# Patient Record
Sex: Female | Born: 1980 | Race: White | Hispanic: No | Marital: Single | Smoking: Current every day smoker
Health system: Southern US, Community
[De-identification: ages and names within clinical notes are randomized; demographics above are authoritative.]

## PROBLEM LIST (undated history)

## (undated) ENCOUNTER — Inpatient Hospital Stay: Payer: Self-pay

## (undated) DIAGNOSIS — F419 Anxiety disorder, unspecified: Secondary | ICD-10-CM

## (undated) DIAGNOSIS — K219 Gastro-esophageal reflux disease without esophagitis: Secondary | ICD-10-CM

## (undated) DIAGNOSIS — F410 Panic disorder [episodic paroxysmal anxiety] without agoraphobia: Secondary | ICD-10-CM

## (undated) DIAGNOSIS — Z789 Other specified health status: Secondary | ICD-10-CM

## (undated) HISTORY — PX: ABDOMINAL HYSTERECTOMY: SHX81

## (undated) HISTORY — PX: NO PAST SURGERIES: SHX2092

---

## 2004-01-13 ENCOUNTER — Emergency Department: Payer: Self-pay | Admitting: Emergency Medicine

## 2004-01-13 ENCOUNTER — Other Ambulatory Visit: Payer: Self-pay

## 2005-05-17 ENCOUNTER — Observation Stay: Payer: Self-pay | Admitting: Unknown Physician Specialty

## 2005-05-24 ENCOUNTER — Inpatient Hospital Stay: Payer: Self-pay | Admitting: Certified Nurse Midwife

## 2006-04-22 ENCOUNTER — Inpatient Hospital Stay: Payer: Self-pay | Admitting: Obstetrics and Gynecology

## 2007-02-14 ENCOUNTER — Emergency Department: Payer: Self-pay | Admitting: Emergency Medicine

## 2007-02-15 ENCOUNTER — Observation Stay: Payer: Self-pay

## 2008-10-29 ENCOUNTER — Emergency Department: Payer: Self-pay | Admitting: Emergency Medicine

## 2009-06-17 ENCOUNTER — Emergency Department: Payer: Self-pay | Admitting: Emergency Medicine

## 2010-04-05 ENCOUNTER — Emergency Department: Payer: Self-pay | Admitting: Emergency Medicine

## 2012-04-09 ENCOUNTER — Emergency Department: Payer: Self-pay | Admitting: Emergency Medicine

## 2012-04-09 LAB — CBC
HCT: 37.8 % (ref 35.0–47.0)
HGB: 12.1 g/dL (ref 12.0–16.0)
MCHC: 32 g/dL (ref 32.0–36.0)
MCV: 78 fL — ABNORMAL LOW (ref 80–100)
RBC: 4.88 10*6/uL (ref 3.80–5.20)
RDW: 14.9 % — ABNORMAL HIGH (ref 11.5–14.5)
WBC: 10.9 10*3/uL (ref 3.6–11.0)

## 2012-04-09 LAB — HCG, QUANTITATIVE, PREGNANCY: Beta Hcg, Quant.: 5154 m[IU]/mL — ABNORMAL HIGH

## 2013-02-14 ENCOUNTER — Emergency Department: Payer: Self-pay | Admitting: Emergency Medicine

## 2013-02-14 LAB — RAPID INFLUENZA A&B ANTIGENS

## 2014-01-31 ENCOUNTER — Emergency Department: Payer: Self-pay | Admitting: Emergency Medicine

## 2014-01-31 LAB — CBC
HCT: 35.6 % (ref 35.0–47.0)
HGB: 11.6 g/dL — ABNORMAL LOW (ref 12.0–16.0)
MCH: 24.7 pg — ABNORMAL LOW (ref 26.0–34.0)
MCHC: 32.5 g/dL (ref 32.0–36.0)
MCV: 76 fL — ABNORMAL LOW (ref 80–100)
PLATELETS: 186 10*3/uL (ref 150–440)
RBC: 4.69 10*6/uL (ref 3.80–5.20)
RDW: 15.8 % — AB (ref 11.5–14.5)
WBC: 8.7 10*3/uL (ref 3.6–11.0)

## 2014-01-31 LAB — URINALYSIS, COMPLETE
Bacteria: NONE SEEN
Bilirubin,UR: NEGATIVE
Blood: NEGATIVE
Glucose,UR: NEGATIVE mg/dL (ref 0–75)
Ketone: NEGATIVE
Leukocyte Esterase: NEGATIVE
NITRITE: NEGATIVE
PH: 6 (ref 4.5–8.0)
PROTEIN: NEGATIVE
RBC,UR: 1 /HPF (ref 0–5)
Specific Gravity: 1.016 (ref 1.003–1.030)
Squamous Epithelial: 1

## 2014-01-31 LAB — COMPREHENSIVE METABOLIC PANEL
ALT: 16 U/L
AST: 19 U/L (ref 15–37)
Albumin: 3.2 g/dL — ABNORMAL LOW (ref 3.4–5.0)
Alkaline Phosphatase: 92 U/L
Anion Gap: 9 (ref 7–16)
BILIRUBIN TOTAL: 0.1 mg/dL — AB (ref 0.2–1.0)
BUN: 7 mg/dL (ref 7–18)
CALCIUM: 7.9 mg/dL — AB (ref 8.5–10.1)
CO2: 18 mmol/L — AB (ref 21–32)
CREATININE: 0.35 mg/dL — AB (ref 0.60–1.30)
Chloride: 109 mmol/L — ABNORMAL HIGH (ref 98–107)
EGFR (African American): >60
EGFR (Non-African Amer.): >60
GLUCOSE: 89 mg/dL (ref 65–99)
Osmolality: 269 (ref 275–301)
Potassium: 3.9 mmol/L (ref 3.5–5.1)
Sodium: 136 mmol/L (ref 136–145)
TOTAL PROTEIN: 7 g/dL (ref 6.4–8.2)

## 2014-01-31 LAB — HCG, QUANTITATIVE, PREGNANCY: Beta Hcg, Quant.: 17905 m[IU]/mL — ABNORMAL HIGH

## 2014-03-10 ENCOUNTER — Emergency Department: Payer: Self-pay | Admitting: Emergency Medicine

## 2014-03-10 LAB — CBC
HCT: 36.3 % (ref 35.0–47.0)
HGB: 11.8 g/dL — ABNORMAL LOW (ref 12.0–16.0)
MCH: 25.5 pg — ABNORMAL LOW (ref 26.0–34.0)
MCHC: 32.4 g/dL (ref 32.0–36.0)
MCV: 79 fL — AB (ref 80–100)
Platelet: 196 10*3/uL (ref 150–440)
RBC: 4.62 10*6/uL (ref 3.80–5.20)
RDW: 15.2 % — ABNORMAL HIGH (ref 11.5–14.5)
WBC: 9.7 10*3/uL (ref 3.6–11.0)

## 2014-03-10 LAB — URINALYSIS, COMPLETE
Bacteria: NONE SEEN
Bilirubin,UR: NEGATIVE
Glucose,UR: NEGATIVE mg/dL (ref 0–75)
KETONE: NEGATIVE
Leukocyte Esterase: NEGATIVE
Nitrite: NEGATIVE
Ph: 7 (ref 4.5–8.0)
Protein: NEGATIVE
Specific Gravity: 1.015 (ref 1.003–1.030)
Squamous Epithelial: 7
WBC UR: 2 /HPF (ref 0–5)

## 2014-03-10 LAB — WET PREP, GENITAL

## 2014-03-10 LAB — HCG, QUANTITATIVE, PREGNANCY: BETA HCG, QUANT.: 3538 m[IU]/mL — AB

## 2014-04-19 ENCOUNTER — Observation Stay: Payer: Self-pay | Admitting: Obstetrics and Gynecology

## 2014-04-19 LAB — URINALYSIS, COMPLETE
Bacteria: NONE SEEN
Bilirubin,UR: NEGATIVE
Glucose,UR: NEGATIVE mg/dL (ref 0–75)
Ketone: NEGATIVE
Leukocyte Esterase: NEGATIVE
NITRITE: NEGATIVE
PH: 7 (ref 4.5–8.0)
Protein: NEGATIVE
RBC,UR: 1 /HPF (ref 0–5)
SPECIFIC GRAVITY: 1.016 (ref 1.003–1.030)
Squamous Epithelial: 1
WBC UR: 1 /HPF (ref 0–5)

## 2014-04-19 LAB — WBC: WBC: 10.9 10*3/uL (ref 3.6–11.0)

## 2014-04-19 LAB — DRUG SCREEN, URINE

## 2014-04-19 LAB — PLATELET COUNT: Platelet: 189 10*3/uL (ref 150–440)

## 2014-04-19 LAB — RAPID HIV SCREEN (HIV 1/2 AB+AG)

## 2014-04-19 LAB — HEMOGLOBIN: HGB: 10.6 g/dL — ABNORMAL LOW (ref 12.0–16.0)

## 2014-04-19 LAB — WET PREP, GENITAL

## 2014-04-19 LAB — GLUCOSE, RANDOM: Glucose: 90 mg/dL (ref 65–99)

## 2014-04-19 LAB — RBC: RBC: 4.08 10*6/uL (ref 3.80–5.20)

## 2014-04-19 LAB — HEMATOCRIT: HCT: 32.9 % — ABNORMAL LOW (ref 35.0–47.0)

## 2014-04-20 LAB — GC/CHLAMYDIA PROBE AMP

## 2014-07-25 ENCOUNTER — Observation Stay
Admit: 2014-07-25 | Disposition: A | Discharge status: Home / Self Care | Payer: Self-pay | Attending: Certified Nurse Midwife | Admitting: Certified Nurse Midwife

## 2014-08-08 ENCOUNTER — Inpatient Hospital Stay
Admit: 2014-08-08 | Disposition: A | Discharge status: Home / Self Care | Payer: Self-pay | Source: Ambulatory Visit | Attending: Certified Nurse Midwife | Admitting: Certified Nurse Midwife

## 2014-08-08 LAB — CBC WITH DIFFERENTIAL/PLATELET
BASOS ABS: 0.1 10*3/uL (ref 0.0–0.1)
Basophil %: 0.5 %
Eosinophil #: 0.1 10*3/uL (ref 0.0–0.7)
Eosinophil %: 0.8 %
HCT: 32.7 % — ABNORMAL LOW (ref 35.0–47.0)
HGB: 10.6 g/dL — ABNORMAL LOW (ref 12.0–16.0)
Lymphocyte #: 2.3 10*3/uL (ref 1.0–3.6)
Lymphocyte %: 15.8 %
MCH: 24.2 pg — AB (ref 26.0–34.0)
MCHC: 32.4 g/dL (ref 32.0–36.0)
MCV: 74 fL — AB (ref 80–100)
MONO ABS: 0.9 x10 3/mm (ref 0.2–0.9)
Monocyte %: 6.4 %
Neutrophil #: 10.9 10*3/uL — ABNORMAL HIGH (ref 1.4–6.5)
Neutrophil %: 76.5 %
PLATELETS: 219 10*3/uL (ref 150–440)
RBC: 4.4 10*6/uL (ref 3.80–5.20)
RDW: 15.7 % — AB (ref 11.5–14.5)
WBC: 14.3 10*3/uL — ABNORMAL HIGH (ref 3.6–11.0)

## 2014-08-08 LAB — DRUG SCREEN, URINE
AMPHETAMINES, UR SCREEN: NEGATIVE
Barbiturates, Ur Screen: NEGATIVE
Benzodiazepine, Ur Scrn: NEGATIVE
Cannabinoid 50 Ng, Ur ~~LOC~~: NEGATIVE
Cocaine Metabolite,Ur ~~LOC~~: NEGATIVE
MDMA (Ecstasy)Ur Screen: NEGATIVE
Methadone, Ur Screen: NEGATIVE
OPIATE, UR SCREEN: NEGATIVE
PHENCYCLIDINE (PCP) UR S: NEGATIVE
Tricyclic, Ur Screen: NEGATIVE

## 2014-08-09 LAB — PLATELET COUNT: PLATELETS: 218 10*3/uL (ref 150–440)

## 2014-08-10 LAB — HEMATOCRIT: HCT: 29.1 % — AB (ref 35.0–47.0)

## 2014-08-11 LAB — BETA STREP CULTURE(ARMC)

## 2014-08-21 NOTE — H&P (Signed)
L&D Evaluation:  History:  HPI 34 year old G9 P6026 with EDC=08/18/2014 by a 11.4 week ultrasound presents at 36.4 weeks with c/o LOF today and contractions, sometimes 15 min apart. No vulvar itching or irritation. Had late onset prenatal care at [facility] 2 weeks ago, other than some [facility] problems. She states [facility] and was found to have oligohydraminos (4.6 cm).  Was started on biweekly NSTs and repeat AFI was 8cm. PNC/hx also remarkable for being RH neg (was given Rhogam at 16 weeks and again on 3/30, smoking, anemia, and obesity. Has had 6 vaginal deliveries in 2002, 2003, 2005, 2007, 2008, 2009. Delivery im 2002 with macrosomic infant/ VAVD/shoulder dystocia. Her last baby had a cardiac defect requiring surgery and this baby was adopted out. O neg, RI, VI,   Presents with contractions, leaking fluid   Patient's Medical History Anemia    Patient's Surgical History none    Medications Pre Natal Vitamins  Iron    Allergies NKDA   Social History tobacco    Family History Non-Contributory    ROS:  ROS see HPI   Exam:  Vital Signs 136/73    Urine Protein not completed   General no apparent distress   Mental Status clear    Abdomen gravid, non-tender   Estimated Fetal Weight Average for gestational age   Fetal Position cephalic   Pelvic no external lesions, SSE: negative pooling, Nitrazine or fern. wet prep negative. Cx: multip/closed internal os/-2   Mebranes Intact   FHT normal rate with no decels, 145 with accles to 180-Cat1   Ucx absent   Skin dry   Impression:  Impression reactive NST, IUP at 36 4/7 weeks with no evidence of SROM or labor.   Plan:  Plan DC home and FU with [facility] as scheduled   Electronic Signatures: Trinna BalloonGutierrez, Reylene Stauder L (CNM)  (Signed 13-Apr-16 21:28)  Authored: L&D Evaluation   Last Updated: 13-Apr-16 21:28 by Trinna BalloonGutierrez, Dimetrius Montfort L (CNM)

## 2014-08-21 NOTE — H&P (Signed)
L&D Evaluation:  History:  HPI 34 year old G9 P6026 with EDC=08/18/2014 by a 11.4 week ultrasound presents at 38.4 weeks from [facility] for a NST. [facility] but refuses to go there for any care since [facility]. Had late onset prenatal care at [facility] at 34 weeks, other than some episodic [facility] for problems such as first trimester bleeding. She states [facility] and was found to have oligohydraminos (4.6 cm).  Was started on biweekly NSTs and a BPP at that time was 8/10. PNC/hx also remarkable for being RH neg (was given Rhogam at 16 weeks and again on 3/30, smoking, anemia, and obesity. Has had 6 vaginal deliveries in 2002, 2003, 2005, 2007, 2008, 2009. Delivery im 2002 with macrosomic infant/ VAVD/shoulder dystocia. Her last baby had a cardiac defect requiring surgery and this baby was adopted out. O neg, RI, VI, GBS unknown.   Presents with NST from [facility]   Patient's Medical History Anemia   Patient's Surgical History none   Medications Pre [place]atal Vitamins  Iron   Allergies NKDA   Social History tobacco   Family History Non-Contributory   ROS:  ROS see HPI   Exam:  Vital Signs stable   Urine Protein not completed   General no apparent distress   Mental Status clear   Abdomen gravid, non-tender   Estimated Fetal Weight Average for gestational age   Fetal Position cephalic   Pelvic no external lesions, 3/85-90/-1   Mebranes Intact   FHT normal rate with no decels, 130s, moderate variability, +accels, no decels   Ucx irregular   Skin dry, no lesions, no rashes   Other AFI today 6.7, no fetal movements or breathing seen on U/S.   Impression:  Impression reactive NST, IUP at 336w4d, insufficient prenatal care, oligohydramnios   Plan:  Plan Will keep for IOL. ABX for GBS, pitocin, UDS, anticipate svd.   Comments Plan discussed with Dr. Vergie LivingPickens- who agrees with the plan for induction due  to insufficient care, oligohydramnios, and scan today with no fetal movement or breathing. Pt made aware and accepts plan of care.   Follow Up Appointment need to schedule   Electronic Signatures: Jannet MantisSubudhi, Gerrad Welker (CNM)  (Signed 27-Apr-16 18:43)  Authored: L&D Evaluation   Last Updated: 27-Apr-16 18:43 by Jannet MantisSubudhi, Labrina Lines (CNM)

## 2014-08-21 NOTE — H&P (Signed)
L&D Evaluation:  History Expanded:  HPI 34 yo [id number] at 6347w5d gestational age by 11 week ultrasound.  Pregnancy complicted by no prenatal care to this point. She presents with an episode of vaginal bleeding that occured early this morning.  She was doing some housework and felt something wet.  When she went to the bathroom she noticed about a 7cm diameter spot that was dark red.  She has had a decrease in the amount she has noticed since then with the darkness decreasing along with a decrease in amount.  She denies a gush of fluid and abdominal pain. She notes positive fetal movement.  She had an episode of vaginal bleeding at 16 weeks for which no etiology was discovered.  She smokes 1/2 ppd cigarettes.  She denies urinary and vaginal symptoms.  Denies having hemorrhoids.  She has also been feeling dizzy.   Blood Type (Maternal) Rh negative   Group B Strep Results Maternal (Result >5wks must be treated as unknown) unknown/result > 5 weeks ago   Maternal HIV Unknown   Maternal Syphilis Ab Unknown   Maternal Varicella Unknown   Rubella Results (Maternal) unknown   Maternal T-Dap Immune   [facility]   Patient's Medical History No Chronic Illness   Patient's Surgical History none   Medications Denies   Allergies NKDA   Social History tob 1/2 ppd, denies EtOH and drug use   Family History Non-Contributory   ROS:  ROS All systems were reviewed.  HEENT, CNS, GI, GU, Respiratory, CV, Renal and Musculoskeletal systems were found to be normal., unless noted in HPI   Exam:  Vital Signs afebrile, normotensive, other vitals normal   General no apparent distress   Mental Status clear   Chest clear   Heart normal sinus rhythm   Abdomen gravid, non-tender   Estimated Fetal Weight appropriate for 22 wks (fundal height about 2cm above umb)   Back no CVAT   Edema no edema   Pelvic no external lesions, cervix closed and thick, scant old-appearing blood in vaginal vault, no  lesions identified.  Cervix difficlt to visualize given redundancy of vaginal tissue   Mebranes Intact   FHT 150 bpm   Ucx absent   Skin no lesions   Impression:  Impression 1) Intrauterine pregnancy at 4747w5d gestational age 59) Vaginal bleeding of uncertain source   Plan:  Comments 1) Vaginal bleeding: obtain ultrasound transabdominally to assess placenta, one vaginally for cervical length.  gonorrhea/chlamydia NAAT, wet prep, UA, UDS 2) no prenatal care: obtain labs, if not already obtained   Electronic Signatures: Conard NovakJackson, Cire Deyarmin D (MD)  (Signed 07-Jan-16 17:15)  Authored: L&D Evaluation   Last Updated: 07-Jan-16 17:15 by Conard NovakJackson, Resean Brander D (MD)

## 2015-04-14 NOTE — L&D Delivery Note (Signed)
Delivery Note  Pt admitted in active labor 12 hrs ago. Stalled at 8 cm, progressed with 2 hours of pitocin and an epidural.  At 11:48 AM a viable and healthy female infant "[name]" was delivered via Vaginal, Spontaneous Delivery (Presentation:OA ).  APGAR: 8, 9; weight 7 lb 4.8 oz (3310 g).   Placenta status: Intact, Spontaneous.  Cord:  with the following complications: None.  Cord pH: not drawn  Nuchal cord tight and not reduced, was delivered through.  Anesthesia: Epidural  Episiotomy: None Lacerations: None Suture Repair: n/a Est. Blood Loss (mL):    Mom to postpartum.  Baby to Couplet care / Skin to Skin.  Kayla DouglasBEASLEY, Kayla Howell 09/17/2015, 11:58 AM

## 2015-05-06 DIAGNOSIS — O0930 Supervision of pregnancy with insufficient antenatal care, unspecified trimester: Secondary | ICD-10-CM | POA: Insufficient documentation

## 2015-05-06 DIAGNOSIS — O99332 Smoking (tobacco) complicating pregnancy, second trimester: Secondary | ICD-10-CM | POA: Insufficient documentation

## 2015-05-06 DIAGNOSIS — O4100X0 Oligohydramnios, unspecified trimester, not applicable or unspecified: Secondary | ICD-10-CM | POA: Insufficient documentation

## 2015-05-06 DIAGNOSIS — F53 Postpartum depression: Secondary | ICD-10-CM | POA: Insufficient documentation

## 2015-05-06 DIAGNOSIS — E669 Obesity, unspecified: Secondary | ICD-10-CM | POA: Insufficient documentation

## 2015-05-07 DIAGNOSIS — D509 Iron deficiency anemia, unspecified: Secondary | ICD-10-CM | POA: Insufficient documentation

## 2015-09-12 ENCOUNTER — Encounter: Payer: Self-pay | Admitting: *Deleted

## 2015-09-12 ENCOUNTER — Inpatient Hospital Stay
Admission: EM | Admit: 2015-09-12 | Discharge: 2015-09-12 | Disposition: A | Discharge status: Home / Self Care | Payer: Self-pay | Attending: Obstetrics and Gynecology | Admitting: Obstetrics and Gynecology

## 2015-09-12 ENCOUNTER — Inpatient Hospital Stay: Payer: Self-pay

## 2015-09-12 DIAGNOSIS — Z3A36 36 weeks gestation of pregnancy: Secondary | ICD-10-CM | POA: Insufficient documentation

## 2015-09-12 DIAGNOSIS — O0933 Supervision of pregnancy with insufficient antenatal care, third trimester: Secondary | ICD-10-CM

## 2015-09-12 DIAGNOSIS — R109 Unspecified abdominal pain: Secondary | ICD-10-CM | POA: Insufficient documentation

## 2015-09-12 DIAGNOSIS — O26893 Other specified pregnancy related conditions, third trimester: Secondary | ICD-10-CM | POA: Insufficient documentation

## 2015-09-12 DIAGNOSIS — O09293 Supervision of pregnancy with other poor reproductive or obstetric history, third trimester: Secondary | ICD-10-CM

## 2015-09-12 LAB — DIFFERENTIAL
Basophils Absolute: 0 10*3/uL (ref 0–0.1)
Basophils Relative: 0 %
EOS ABS: 0.1 10*3/uL (ref 0–0.7)
Lymphocytes Relative: 14 %
Lymphs Abs: 1.8 10*3/uL (ref 1.0–3.6)
Monocytes Absolute: 0.9 10*3/uL (ref 0.2–0.9)
Neutro Abs: 9.5 10*3/uL — ABNORMAL HIGH (ref 1.4–6.5)
Neutrophils Relative %: 77 %

## 2015-09-12 LAB — URINALYSIS COMPLETE WITH MICROSCOPIC (ARMC ONLY)
BILIRUBIN URINE: NEGATIVE
Glucose, UA: NEGATIVE mg/dL
Hgb urine dipstick: NEGATIVE
KETONES UR: NEGATIVE mg/dL
LEUKOCYTES UA: NEGATIVE
NITRITE: NEGATIVE
PH: 8 (ref 5.0–8.0)
Protein, ur: NEGATIVE mg/dL
RBC / HPF: NONE SEEN RBC/hpf (ref 0–5)
SPECIFIC GRAVITY, URINE: 1.016 (ref 1.005–1.030)

## 2015-09-12 LAB — TYPE AND SCREEN
ABO/RH(D): O NEG
Antibody Screen: NEGATIVE

## 2015-09-12 LAB — URINE DRUG SCREEN, QUALITATIVE (ARMC ONLY)
AMPHETAMINES, UR SCREEN: NOT DETECTED
Barbiturates, Ur Screen: NOT DETECTED
Benzodiazepine, Ur Scrn: NOT DETECTED
COCAINE METABOLITE, UR ~~LOC~~: NOT DETECTED
Cannabinoid 50 Ng, Ur ~~LOC~~: NOT DETECTED
MDMA (Ecstasy)Ur Screen: NOT DETECTED
METHADONE SCREEN, URINE: NOT DETECTED
Opiate, Ur Screen: NOT DETECTED
PHENCYCLIDINE (PCP) UR S: NOT DETECTED
Tricyclic, Ur Screen: NOT DETECTED

## 2015-09-12 LAB — ABO/RH: ABO/RH(D): O NEG

## 2015-09-12 LAB — CBC
HCT: 30.5 % — ABNORMAL LOW (ref 35.0–47.0)
HEMOGLOBIN: 9.5 g/dL — AB (ref 12.0–16.0)
MCH: 21.9 pg — AB (ref 26.0–34.0)
MCHC: 31.2 g/dL — ABNORMAL LOW (ref 32.0–36.0)
MCV: 70.5 fL — ABNORMAL LOW (ref 80.0–100.0)
Platelets: 242 10*3/uL (ref 150–440)
RBC: 4.33 MIL/uL (ref 3.80–5.20)
RDW: 16.5 % — AB (ref 11.5–14.5)
WBC: 12.4 10*3/uL — ABNORMAL HIGH (ref 3.6–11.0)

## 2015-09-12 LAB — OB RESULTS CONSOLE GBS: GBS: NEGATIVE

## 2015-09-12 LAB — RAPID HIV SCREEN (HIV 1/2 AB+AG)
HIV 1/2 Antibodies: NONREACTIVE
HIV-1 P24 Antigen - HIV24: NONREACTIVE

## 2015-09-12 MED ORDER — FLUCONAZOLE 50 MG PO TABS
ORAL_TABLET | ORAL | Status: AC
  Administered 2015-09-12: 150 mg via ORAL
  Filled 2015-09-12: qty 3

## 2015-09-12 MED ORDER — FLUCONAZOLE 50 MG PO TABS
150.0000 mg | ORAL_TABLET | Freq: Once | ORAL | Status: AC
  Administered 2015-09-12: 150 mg via ORAL

## 2015-09-12 NOTE — Plan of Care (Signed)
Report to MD regarding an additional issue with the patients limited prenatal care.   Pt is A negative blood type. Pt states that she did not receive Rhogam with this pregnancy. RN discussed need for her to have this. She states that she was told she did not need this until the baby was born and then would receive it if needed. I again discussed with her that she did need it at this time and that when the baby delivers the babies blood would be tested for further need.  Pt agrees she will discuss with MD at appt that has been scheduled for Monday, June 5 at 0930. MD aware of this. Ellison Carwin Cassanda Walmer RNC

## 2015-09-12 NOTE — Plan of Care (Signed)
Discharge instructions, both oral and written, given to pt. She has next appt to restart her [facility] Monday, June 5 at 0930.  Pt given medication for her yeast infection and is aware of pending urine testing per MD.  Pt ready to leave department in stable condition ambulatory for home.  Ellison Carwin Areil Ottey RNC

## 2015-09-12 NOTE — Discharge Instructions (Signed)
[facility] in next few days. RN getting appt for pt. For Monday at 0930 June 5. Pt agrees with plan of care.  Pt given medication for yeast as ordered per MD. Pt ready to leave dept in stable condition ambulatory. Kayla Howell Brittanie Dosanjh RNC

## 2015-09-12 NOTE — OB Triage Provider Note (Addendum)
TRIAGE VISIT with NST   Kayla Howell is a 35 y.o. G7P6. She is at [redacted]w[redacted]d gestation, presenting with uterine contractions  Indication: Contractions  S: Resting comfortably. No CTX, no VB. Active fetal movement. Concerned about back cramping, worries about oligo.  Preg c/b: - Insufficient prenatal care: 1 new OB visit at 18+5 wks, dated from that 2nd trimester scan. No prenatal visits since that time. - Hx of oligo with prior pregnancy, IOL at 101 wks - Grand multiparity: G7P6 with 6 NSVD- desires BTL - 1/2 ppd smoking - Insuffienct material resources  O:  BP 130/78 mmHg  Pulse 85  Temp(Src) 98.3 F (36.8 C) (Oral)  Resp 18  Ht  (1.778 m)  Wt 200 lb (90.719 kg)  BMI 28.70 kg/m2 Results for orders placed or performed during the hospital encounter of 09/12/15 (from the past 48 hour(s))  Urine Drug Screen, Qualitative ([facility] only)   Collection Time: 09/12/15 12:13 PM  Result Value Ref Range   Tricyclic, Ur Screen NONE DETECTED NONE DETECTED   Amphetamines, Ur Screen NONE DETECTED NONE DETECTED   MDMA (Ecstasy)Ur Screen NONE DETECTED NONE DETECTED   Cocaine Metabolite,Ur [place] NONE DETECTED NONE DETECTED   Opiate, Ur Screen NONE DETECTED NONE DETECTED   Phencyclidine (PCP) Ur S NONE DETECTED NONE DETECTED   Cannabinoid 50 Ng, Ur  NONE DETECTED NONE DETECTED   Barbiturates, Ur Screen NONE DETECTED NONE DETECTED   Benzodiazepine, Ur Scrn NONE DETECTED NONE DETECTED   Methadone Scn, Ur NONE DETECTED NONE DETECTED  CBC   Collection Time: 09/12/15 12:20 PM  Result Value Ref Range   WBC 12.4 (H) 3.6 - 11.0 K/uL   RBC 4.33 3.80 - 5.20 MIL/uL   Hemoglobin 9.5 (L) 12.0 - 16.0 g/dL   HCT 16.1 (L) 09.6 - 04.5 %   MCV 70.5 (L) 80.0 - 100.0 fL   MCH 21.9 (L) 26.0 - 34.0 pg   MCHC 31.2 (L) 32.0 - 36.0 g/dL   RDW 40.9 (H) 81.1 - 91.4 %   Platelets 242 150 - 440 K/uL  Differential   Collection Time: 09/12/15 12:20 PM  Result Value Ref Range   Neutrophils Relative % 77% %   Neutro  Abs 9.5 (H) 1.4 - 6.5 K/uL   Lymphocytes Relative 14% %   Lymphs Abs 1.8 1.0 - 3.6 K/uL   Monocytes Relative 8% %   Monocytes Absolute 0.9 0.2 - 0.9 K/uL   Eosinophils Relative 1% %   Eosinophils Absolute 0.1 0 - 0.7 K/uL   Basophils Relative 0% %   Basophils Absolute 0.0 0 - 0.1 K/uL  Rapid HIV screen (HIV 1/2 Ab+Ag)   Collection Time: 09/12/15 12:20 PM  Result Value Ref Range   HIV-1 P24 Antigen - HIV24 NON REACTIVE NON REACTIVE   HIV 1/2 Antibodies NON REACTIVE NON REACTIVE   Interpretation (HIV Ag Ab)      A non reactive test result means that HIV 1 or HIV 2 antibodies and HIV 1 p24 antigen were not detected in the specimen.  Type and screen [facility]   Collection Time: 09/12/15 12:20 PM  Result Value Ref Range   ABO/RH(D) PENDING    Antibody Screen PENDING    Sample Expiration 09/15/2015      Urine sample pending analyusis  Gen: NAD, AAOx3      Abd: FNTTP      Ext: Non-tender, Nonedmeatous    FHT: 145, mod var, +accels, no decels TOCO: quiet NWG:NFAOZHYQ: 2 Effacement (%):  50 Station:  (-4) Presentation: Vertex Exam by:: BB MD  NST: Reactive. See FHT above for particulars.  A/P:  35 y.o. G1P0 6360w5d with cramping.   Labor: not present.   R/o ROM: SSE positive for pseudohyphae. Diflucan ordered  Incomplete prenatal care: Labs including GBS ordered today  Desires BTL: MA31 papers signed with patient today.  Fetal Wellbeing: Reassuring Cat 1 tracing.  Hx of oligo: Formal u/s with AFI of 10, vtx. D/c home stable, precautions reviewed. [facility]

## 2015-09-12 NOTE — Plan of Care (Signed)
Pt left department with discharge with her saline lock in place that was initiated per EMS.  RN tried to find patient in hospital soon after her departure but was not located.   Patient's phone numbers, cell and home, all called to locate pt. But she did not answer any of these numbers.  Will continue to try and call patient. Ellison Carwin Danyell Shader RNC

## 2015-09-13 LAB — HEPATITIS B SURFACE ANTIGEN: Hepatitis B Surface Ag: NEGATIVE

## 2015-09-13 LAB — RPR: RPR Ser Ql: NONREACTIVE

## 2015-09-13 LAB — RUBELLA SCREEN: Rubella: 1.58 index (ref >0.99)

## 2015-09-14 LAB — CULTURE, BETA STREP (GROUP B ONLY)

## 2015-09-16 ENCOUNTER — Inpatient Hospital Stay
Admit: 2015-09-16 | Discharge: 2015-09-19 | DRG: 775 | Disposition: A | Discharge status: Home / Self Care | Payer: Self-pay | Attending: Obstetrics and Gynecology | Admitting: Obstetrics and Gynecology

## 2015-09-16 DIAGNOSIS — Z3A37 37 weeks gestation of pregnancy: Secondary | ICD-10-CM

## 2015-09-16 DIAGNOSIS — Z3490 Encounter for supervision of normal pregnancy, unspecified, unspecified trimester: Secondary | ICD-10-CM

## 2015-09-16 DIAGNOSIS — F17210 Nicotine dependence, cigarettes, uncomplicated: Secondary | ICD-10-CM | POA: Diagnosis present

## 2015-09-16 DIAGNOSIS — O0933 Supervision of pregnancy with insufficient antenatal care, third trimester: Secondary | ICD-10-CM

## 2015-09-16 DIAGNOSIS — O99334 Smoking (tobacco) complicating childbirth: Secondary | ICD-10-CM | POA: Diagnosis present

## 2015-09-16 HISTORY — DX: Other specified health status: Z78.9

## 2015-09-16 LAB — CBC
HEMATOCRIT: 30.8 % — AB (ref 35.0–47.0)
HEMOGLOBIN: 10 g/dL — AB (ref 12.0–16.0)
MCH: 22.4 pg — ABNORMAL LOW (ref 26.0–34.0)
MCHC: 32.4 g/dL (ref 32.0–36.0)
MCV: 69.1 fL — AB (ref 80.0–100.0)
Platelets: 284 10*3/uL (ref 150–440)
RBC: 4.46 MIL/uL (ref 3.80–5.20)
RDW: 16.4 % — ABNORMAL HIGH (ref 11.5–14.5)
WBC: 15.3 10*3/uL — AB (ref 3.6–11.0)

## 2015-09-16 MED ORDER — OXYTOCIN 40 UNITS IN LACTATED RINGERS INFUSION - SIMPLE MED
2.5000 [IU]/h | INTRAVENOUS | Status: DC
  Administered 2015-09-17: 2.5 [IU]/h via INTRAVENOUS
  Filled 2015-09-16: qty 1000

## 2015-09-16 MED ORDER — LACTATED RINGERS IV SOLN
INTRAVENOUS | Status: DC
  Administered 2015-09-16 – 2015-09-17 (×2): via INTRAVENOUS

## 2015-09-16 MED ORDER — ACETAMINOPHEN 325 MG PO TABS: 650.0000 mg | ORAL_TABLET | ORAL | Status: DC | PRN

## 2015-09-16 MED ORDER — OXYTOCIN BOLUS FROM INFUSION
500.0000 mL | INTRAVENOUS | Status: DC
  Administered 2015-09-17: 500 mL via INTRAVENOUS

## 2015-09-16 MED ORDER — LIDOCAINE HCL (PF) 1 % IJ SOLN: 30.0000 mL | INTRAMUSCULAR | Status: DC | PRN

## 2015-09-16 MED ORDER — ONDANSETRON HCL 4 MG/2ML IJ SOLN
4.0000 mg | Freq: Four times a day (QID) | INTRAMUSCULAR | Status: DC | PRN
  Administered 2015-09-17: 4 mg via INTRAVENOUS
  Filled 2015-09-16: qty 2

## 2015-09-16 MED ORDER — LACTATED RINGERS IV SOLN
500.0000 mL | INTRAVENOUS | Status: DC | PRN
  Administered 2015-09-16: 500 mL via INTRAVENOUS

## 2015-09-16 MED ORDER — SOD CITRATE-CITRIC ACID 500-334 MG/5ML PO SOLN
30.0000 mL | ORAL | Status: DC | PRN
  Administered 2015-09-17 (×2): 30 mL via ORAL
  Filled 2015-09-16 (×2): qty 15

## 2015-09-16 NOTE — Progress Notes (Signed)
Pt admitted to LDR 5 via EMS with c/o contractions. Cervical exam showed 7cm, no leaking fluid, Dr. Malena EdmanBeasly called and admitted for delivery.

## 2015-09-16 NOTE — H&P (Signed)
Kayla Howell is a 35 y.o. female [phone] at 37+2wks presenting for contractions. Dated from an 18wk u/s. LMP 11/25/14. Pelvis proven to 9#13 oz.   Preg c/b: - Insufficient prenatal care: 1 new OB visit at 18+5 wks, dated from that 2nd trimester scan. No prenatal visits since that time. Triage visit on 09/12/15 with normal fluid [facility]. - Hx of oligo with prior pregnancy, IOL at 1039 wks - Grand multiparity: G7P6 with 6 NSVD- desires BTL - 1/2 ppd smoking - Insufficient material resources - Rh neg in this pregnancy, no Rhogam given - Desires BTL: St[facility] at [facility]- however, MA31 papers signed on 09/12/15 in the triage area, but are unavailable today. 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- no meds - Discrepancy between Gs and Ps- [facility],  Date Out GA Labor/2nd Wt Sex Deliv Anes Pre Liv A1 A5 Name Loc Clin  2001 TAB 61108w0d              2001 SAB 7259w0d              09/15/2000 Term 3839w0d  9 lb 13 oz M Vag-Spont EPI N YES    Other   03/03/2002 Term 3939w0d  8 lb 2 oz M Vag-Spont EPI N YES    Other   08/31/2003 Term 8839w0d  8 lb 6 oz F Vag-Spont None N YES    Other   05/24/2005 Term 5739w0d  7 lb 11 oz F Vag-Spont None N YES    Other   2008 Term 839w0d  8 lb 2 oz  Vag-Spont  N YES       06/2007 Term 2339w0d  7 lb 8.8 oz M Vag-Spont EPI N YES        Gravida                  Maternal Medical History:  Reason for admission: Nausea.    OB History    Gravida Para Term Preterm AB TAB SAB Ectopic Multiple Living   9 6   2 1 1         Past Medical History  Diagnosis Date  . Medical history non-contributory    History reviewed. No pertinent past surgical history. Family History: family history is not on file. Social History:  reports that she has been smoking.  She does not have any smokeless tobacco history on file. She reports that she does not drink alcohol or use  illicit drugs.   Prenatal Transfer Tool    Review of Systems  Constitutional: Negative for fever and chills.  Eyes: Negative for blurred vision and double vision.  Respiratory: Negative for shortness of breath.   Cardiovascular: Negative for chest pain and palpitations.  Gastrointestinal: Negative for nausea, vomiting, abdominal pain, diarrhea and constipation.  Genitourinary: Negative for dysuria, urgency, frequency and flank pain.  Neurological: Negative for headaches.  Psychiatric/Behavioral: Negative for depression.    Dilation: 7 Effacement (%): 80 Station: +1 Exam by:: NS RN Blood pressure 143/78, pulse 108, temperature 97.3 F (36.3 C), temperature source Oral, resp. rate 18, height 5\' 10"  (1.778 m), weight 200 lb (90.719 kg). Maternal Exam:  Uterine Assessment: Contraction strength is moderate.  Contraction duration is 60 seconds. Contraction frequency is regular.   Abdomen: Patient reports no abdominal tenderness. Estimated fetal weight is 7#6oz.   Fetal presentation: vertex  Introitus: Normal vulva. Normal vagina.  Pelvis: adequate for delivery.  Cervix: Cervix evaluated by digital exam.     Fetal Exam Fetal State Assessment: Category I - tracings are normal.     Physical Exam  Constitutional: She is oriented to person, place, and time. She appears well-developed and well-nourished. No distress.  HENT:  Poor dentition  Eyes: No scleral icterus.  Neck: Normal range of motion. Neck supple.  Cardiovascular: Normal rate, regular rhythm, normal heart sounds and intact distal pulses.   Respiratory: Effort normal and breath sounds normal. No respiratory distress.  GI: Soft. She exhibits no distension. There is no tenderness.  Genitourinary: Vagina normal and uterus normal.  Musculoskeletal: Normal range of motion.  Neurological: She is alert and oriented to person, place, and time.  Skin: Skin is warm and dry.  Psychiatric: She has a normal mood and affect.     Prenatal labs: ABO, Rh: --/--/O NEG (06/01 1221) Antibody: NEG (06/01 1220) Rubella: 1.58 (06/01 1220) RPR: Non Reactive (06/01 1220)  HBsAg: Negative (06/01 1220)  HIV:   non reactive GBS: Negative (06/01 0000)   Assessment/Plan: 1. Fetal Well being  - Fetal Tracing: Cat I strip - Group B Streptococcus ppx indicated: none - Presentation: Vtx confirmed by [place]   2. Routine OB: - Prenatal labs reviewed, as above - Rh neg - Varicella unknown- Ab screen ordered with admission labs  3. Monitoring of Labor:  -  Contractions 5-6 external toco in place -  Pelvis proven to 9#13oz -  Plan for continuous fetal monitoring  -  Maternal pain control with iv or regional as desired - Anticipate vaginal delivery  4. Post Partum Planning: - Infant feeding: Bottle - Contraception: BTL   5. Elevated BP, mild range - PreE labs ordered on admission. Neg proteuria last week.  Christeen Douglas 09/17/2015, 12:03 AM

## 2015-09-17 ENCOUNTER — Inpatient Hospital Stay: Payer: Self-pay | Admitting: Anesthesiology

## 2015-09-17 ENCOUNTER — Encounter: Payer: Self-pay | Admitting: Obstetrics and Gynecology

## 2015-09-17 LAB — COMPREHENSIVE METABOLIC PANEL
ALT: 11 U/L — ABNORMAL LOW (ref 14–54)
ANION GAP: 11 (ref 5–15)
AST: 17 U/L (ref 15–41)
Albumin: 3.4 g/dL — ABNORMAL LOW (ref 3.5–5.0)
Alkaline Phosphatase: 184 U/L — ABNORMAL HIGH (ref 38–126)
BUN: 8 mg/dL (ref 6–20)
CHLORIDE: 104 mmol/L (ref 101–111)
CO2: 17 mmol/L — ABNORMAL LOW (ref 22–32)
Calcium: 9.3 mg/dL (ref 8.9–10.3)
Creatinine, Ser: 0.45 mg/dL (ref 0.44–1.00)
Glucose, Bld: 112 mg/dL — ABNORMAL HIGH (ref 65–99)
POTASSIUM: 3.8 mmol/L (ref 3.5–5.1)
Sodium: 132 mmol/L — ABNORMAL LOW (ref 135–145)
Total Bilirubin: 0.3 mg/dL (ref 0.3–1.2)
Total Protein: 7 g/dL (ref 6.5–8.1)

## 2015-09-17 MED ORDER — IBUPROFEN 600 MG PO TABS
ORAL_TABLET | ORAL | Status: AC
  Administered 2015-09-17: 600 mg via ORAL
  Filled 2015-09-17: qty 1

## 2015-09-17 MED ORDER — NALBUPHINE HCL 10 MG/ML IJ SOLN: 5.0000 mg | Freq: Once | INTRAMUSCULAR | Status: DC | PRN

## 2015-09-17 MED ORDER — COCONUT OIL OIL: 1.0000 "application " | TOPICAL_OIL | Status: DC | PRN

## 2015-09-17 MED ORDER — DIPHENHYDRAMINE HCL 25 MG PO CAPS: 25.0000 mg | ORAL_CAPSULE | ORAL | Status: DC | PRN

## 2015-09-17 MED ORDER — SODIUM CHLORIDE 0.9% FLUSH: 3.0000 mL | Freq: Two times a day (BID) | INTRAVENOUS | Status: DC

## 2015-09-17 MED ORDER — SODIUM CHLORIDE 0.9 % IV SOLN: 250.0000 mL | INTRAVENOUS | Status: DC | PRN

## 2015-09-17 MED ORDER — ZOLPIDEM TARTRATE 5 MG PO TABS: 5.0000 mg | ORAL_TABLET | Freq: Every evening | ORAL | Status: DC | PRN

## 2015-09-17 MED ORDER — NALOXONE HCL 0.4 MG/ML IJ SOLN: 0.4000 mg | INTRAMUSCULAR | Status: DC | PRN

## 2015-09-17 MED ORDER — SENNOSIDES-DOCUSATE SODIUM 8.6-50 MG PO TABS
2.0000 | ORAL_TABLET | ORAL | Status: DC
  Administered 2015-09-17 – 2015-09-18 (×2): 2 via ORAL
  Filled 2015-09-17 (×2): qty 2

## 2015-09-17 MED ORDER — LIDOCAINE-EPINEPHRINE (PF) 1.5 %-1:200000 IJ SOLN
INTRAMUSCULAR | Status: DC | PRN
  Administered 2015-09-17: 3 mL via EPIDURAL

## 2015-09-17 MED ORDER — TETANUS-DIPHTH-ACELL PERTUSSIS 5-2.5-18.5 LF-MCG/0.5 IM SUSP: 0.5000 mL | Freq: Once | INTRAMUSCULAR | Status: DC

## 2015-09-17 MED ORDER — FLEET ENEMA 7-19 GM/118ML RE ENEM: 1.0000 | ENEMA | Freq: Every day | RECTAL | Status: DC | PRN

## 2015-09-17 MED ORDER — MEASLES, MUMPS & RUBELLA VAC ~~LOC~~ INJ
0.5000 mL | INJECTION | Freq: Once | SUBCUTANEOUS | Status: DC
  Filled 2015-09-17: qty 0.5

## 2015-09-17 MED ORDER — SIMETHICONE 80 MG PO CHEW: 80.0000 mg | CHEWABLE_TABLET | ORAL | Status: DC | PRN

## 2015-09-17 MED ORDER — IBUPROFEN 600 MG PO TABS
600.0000 mg | ORAL_TABLET | Freq: Four times a day (QID) | ORAL | Status: DC
  Administered 2015-09-17 – 2015-09-19 (×6): 600 mg via ORAL
  Filled 2015-09-17 (×6): qty 1

## 2015-09-17 MED ORDER — MEPERIDINE HCL 25 MG/ML IJ SOLN: 6.2500 mg | INTRAMUSCULAR | Status: DC | PRN

## 2015-09-17 MED ORDER — AMMONIA AROMATIC IN INHA
RESPIRATORY_TRACT | Status: AC
  Filled 2015-09-17: qty 10

## 2015-09-17 MED ORDER — PRENATAL MULTIVITAMIN CH
1.0000 | ORAL_TABLET | Freq: Every day | ORAL | Status: DC
  Administered 2015-09-18: 1 via ORAL
  Filled 2015-09-17: qty 1

## 2015-09-17 MED ORDER — BENZOCAINE-MENTHOL 20-0.5 % EX AERO: 1.0000 "application " | INHALATION_SPRAY | CUTANEOUS | Status: DC | PRN

## 2015-09-17 MED ORDER — SODIUM CHLORIDE FLUSH 0.9 % IV SOLN
INTRAVENOUS | Status: AC
  Filled 2015-09-17: qty 10

## 2015-09-17 MED ORDER — NALBUPHINE HCL 10 MG/ML IJ SOLN: 5.0000 mg | INTRAMUSCULAR | Status: DC | PRN

## 2015-09-17 MED ORDER — TERBUTALINE SULFATE 1 MG/ML IJ SOLN: 0.2500 mg | Freq: Once | INTRAMUSCULAR | Status: DC | PRN

## 2015-09-17 MED ORDER — ACETAMINOPHEN 325 MG PO TABS: 650.0000 mg | ORAL_TABLET | ORAL | Status: DC | PRN

## 2015-09-17 MED ORDER — FENTANYL 2.5 MCG/ML W/ROPIVACAINE 0.2% IN NS 100 ML EPIDURAL INFUSION (ARMC-ANES)
10.0000 mL/h | EPIDURAL | Status: DC
  Administered 2015-09-17 (×2): 10 mL/h via EPIDURAL
  Filled 2015-09-17: qty 100

## 2015-09-17 MED ORDER — FENTANYL 2.5 MCG/ML W/ROPIVACAINE 0.2% IN NS 100 ML EPIDURAL INFUSION (ARMC-ANES)
EPIDURAL | Status: AC
  Administered 2015-09-17: 10 mL/h via EPIDURAL
  Filled 2015-09-17: qty 100

## 2015-09-17 MED ORDER — SODIUM CHLORIDE 0.9% FLUSH: 3.0000 mL | INTRAVENOUS | Status: DC | PRN

## 2015-09-17 MED ORDER — LIDOCAINE HCL (PF) 1 % IJ SOLN
INTRAMUSCULAR | Status: AC
  Filled 2015-09-17: qty 30

## 2015-09-17 MED ORDER — NALOXONE HCL 2 MG/2ML IJ SOSY
1.0000 ug/kg/h | PREFILLED_SYRINGE | INTRAMUSCULAR | Status: DC | PRN
  Filled 2015-09-17: qty 2

## 2015-09-17 MED ORDER — OXYTOCIN 10 UNIT/ML IJ SOLN
INTRAMUSCULAR | Status: AC
  Filled 2015-09-17: qty 2

## 2015-09-17 MED ORDER — ONDANSETRON HCL 4 MG/2ML IJ SOLN: 4.0000 mg | Freq: Three times a day (TID) | INTRAMUSCULAR | Status: DC | PRN

## 2015-09-17 MED ORDER — LIDOCAINE HCL (PF) 1 % IJ SOLN
INTRAMUSCULAR | Status: DC | PRN
  Administered 2015-09-17: 5 mL via SUBCUTANEOUS

## 2015-09-17 MED ORDER — MISOPROSTOL 200 MCG PO TABS
ORAL_TABLET | ORAL | Status: AC
  Filled 2015-09-17: qty 4

## 2015-09-17 MED ORDER — ONDANSETRON HCL 4 MG/2ML IJ SOLN: 4.0000 mg | INTRAMUSCULAR | Status: DC | PRN

## 2015-09-17 MED ORDER — BISACODYL 10 MG RE SUPP: 10.0000 mg | Freq: Every day | RECTAL | Status: DC | PRN

## 2015-09-17 MED ORDER — OXYTOCIN 40 UNITS IN LACTATED RINGERS INFUSION - SIMPLE MED
1.0000 m[IU]/min | INTRAVENOUS | Status: DC
  Administered 2015-09-17: 1 m[IU]/min via INTRAVENOUS

## 2015-09-17 MED ORDER — KETOROLAC TROMETHAMINE 30 MG/ML IJ SOLN: 30.0000 mg | Freq: Four times a day (QID) | INTRAMUSCULAR | Status: DC | PRN

## 2015-09-17 MED ORDER — ONDANSETRON HCL 4 MG PO TABS: 4.0000 mg | ORAL_TABLET | ORAL | Status: DC | PRN

## 2015-09-17 MED ORDER — DIPHENHYDRAMINE HCL 50 MG/ML IJ SOLN: 12.5000 mg | INTRAMUSCULAR | Status: DC | PRN

## 2015-09-17 MED ORDER — SODIUM CHLORIDE 0.9 % IV SOLN
INTRAVENOUS | Status: DC | PRN
  Administered 2015-09-17 (×2): 5 mL via EPIDURAL

## 2015-09-17 MED ORDER — WITCH HAZEL-GLYCERIN EX PADS: 1.0000 "application " | MEDICATED_PAD | CUTANEOUS | Status: DC | PRN

## 2015-09-17 MED ORDER — DIBUCAINE 1 % RE OINT: 1.0000 "application " | TOPICAL_OINTMENT | RECTAL | Status: DC | PRN

## 2015-09-17 MED ORDER — SODIUM CHLORIDE 0.9 % IJ SOLN
INTRAMUSCULAR | Status: AC
  Filled 2015-09-17: qty 50

## 2015-09-17 NOTE — Anesthesia Procedure Notes (Signed)
Epidural Patient location during procedure: OB Start time: 09/17/2015 12:24 AM End time: 09/17/2015 12:53 AM  Staffing Anesthesiologist: Lenard SimmerKARENZ, Tyshan Enderle Performed by: anesthesiologist   Preanesthetic Checklist Completed: patient identified, site marked, surgical consent, pre-op evaluation, timeout performed, IV checked, risks and benefits discussed and monitors and equipment checked  Epidural Patient position: sitting Prep: ChloraPrep Patient monitoring: heart rate, continuous pulse ox and blood pressure Approach: midline Location: L4-L5 Injection technique: LOR saline  Needle:  Needle type: Tuohy  Needle gauge: 17 G Needle length: 9 cm and 9 Needle insertion depth: 6 cm Catheter type: closed end flexible Catheter size: 19 Gauge Catheter at skin depth: 10.5 cm Test dose: negative and 1.5% lidocaine with Epi 1:200 K  Assessment Events: blood not aspirated, injection not painful, no injection resistance, negative IV test and no paresthesia  Additional Notes   Patient tolerated the insertion well without complications.Reason for block:procedure for pain

## 2015-09-17 NOTE — Progress Notes (Signed)
Kayla Howell is a 35 y.o. G9P0020 at 7454w3d by second trimester ultrasound admitted for active labor  Subjective: S/p epidural, comfortable  Objective: BP 129/76 mmHg  Pulse 84  Temp(Src) 97.8 F (36.6 C) (Oral)  Resp 18  Ht 5\' 10"  (1.778 m)  Wt 200 lb (90.719 kg)  BMI 28.70 kg/m2   Total I/O In: 841.8 [I.V.:841.7; Other:0.2] Out: -   FHT:  FHR: 135 bpm, variability: moderate,  accelerations:  Present,  decelerations:  Absent UC:   irregular, every 8 minutes SVE:   Dilation: 8 Effacement (%): 90 Station: +1 Exam by:: Airi Copado  Labs: Lab Results  Component Value Date   WBC 15.3* 09/16/2015   HGB 10.0* 09/16/2015   HCT 30.8* 09/16/2015   MCV 69.1* 09/16/2015   PLT 284 09/16/2015    Assessment / Plan: Protracted active phase  Labor: Stalled at 8 cm. AROM for scant clear fluid at 0600. IUPC placed.  Preeclampsia:  normal BP, no s/s of PreE Fetal Wellbeing:  Category I Pain Control:  Epidural I/D:  n/a Anticipated MOD:  NSVD  Trevaun Rendleman 09/17/2015, 6:14 AM

## 2015-09-17 NOTE — Progress Notes (Signed)
Patient ID: Kayla Howell, female   DOB: 1980-06-04, 35 y.o.   MRN: [id number]  S: Pt comfortable with epidural  O: Filed Vitals:   09/17/15 0731 09/17/15 0743  BP: 132/78 121/78  Pulse: 83 103  Temp: 98.4 F (36.9 C)   Resp: 16    SVE: 8/90/+1, well applied, ROT Toco: flat FHT: mod var, +accels, occasional decels that are not related to contractions and appear early in nature  A: Grand multip, active labor stalled at 8 cm  P: IUPC removed as fell out on its own and external monitoring is adequate - Start pitocin titration. Fetal head in OT position. - Alternate between Exaggerated Sims positioning and Taylor seated positioning, with hands/knees Antonieta Pert(Gaskin) positioing if patient able - Continuous monitoring. - Recheck as clinically appropriate

## 2015-09-17 NOTE — Progress Notes (Signed)
Patient ID: Kayla Howell, female   DOB: 08-30-1980, 35 y.o.   MRN: [id number]  Cat II strip with earlies and variable decels Completely dilated, practice push with good maternal effort but no progress. Some hard stool in rectum. Will continue pushing. Will remove stool when clinically appropriate.

## 2015-09-17 NOTE — Anesthesia Preprocedure Evaluation (Signed)
Anesthesia Evaluation  Patient identified by MRN, date of birth, ID band Patient awake    Reviewed: Allergy & Precautions, H&P , NPO status , Patient's Chart, lab work & pertinent test results, reviewed documented beta blocker date and time   History of Anesthesia Complications Negative for: history of anesthetic complications  Airway Mallampati: III  TM Distance: >3 FB Neck ROM: full    Dental no notable dental hx. (+) Teeth Intact   Pulmonary neg shortness of breath, neg sleep apnea, neg COPD, neg recent URI, Current Smoker,    Pulmonary exam normal breath sounds clear to auscultation       Cardiovascular Exercise Tolerance: Good negative cardio ROS Normal cardiovascular exam Rhythm:regular Rate:Normal     Neuro/Psych negative neurological ROS  negative psych ROS   GI/Hepatic Neg liver ROS, GERD  Medicated and Controlled,  Endo/Other  negative endocrine ROS  Renal/GU negative Renal ROS  negative genitourinary   Musculoskeletal   Abdominal   Peds  Hematology negative hematology ROS (+)   Anesthesia Other Findings Past Medical History:   Medical history non-contributory                             Reproductive/Obstetrics (+) Pregnancy                             Anesthesia Physical Anesthesia Plan  ASA: II  Anesthesia Plan: Epidural   Post-op Pain Management:    Induction:   Airway Management Planned:   Additional Equipment:   Intra-op Plan:   Post-operative Plan:   Informed Consent: I have reviewed the patients History and Physical, chart, labs and discussed the procedure including the risks, benefits and alternatives for the proposed anesthesia with the patient or authorized representative who has indicated his/her understanding and acceptance.   Dental Advisory Given  Plan Discussed with: Anesthesiologist, CRNA and Surgeon  Anesthesia Plan Comments:          Anesthesia Quick Evaluation

## 2015-09-18 LAB — TYPE AND SCREEN
ABO/RH(D): O NEG
ANTIBODY SCREEN: NEGATIVE

## 2015-09-18 LAB — CBC
HEMATOCRIT: 29.2 % — AB (ref 35.0–47.0)
HEMOGLOBIN: 9.2 g/dL — AB (ref 12.0–16.0)
MCH: 22.6 pg — ABNORMAL LOW (ref 26.0–34.0)
MCHC: 31.6 g/dL — ABNORMAL LOW (ref 32.0–36.0)
MCV: 71.6 fL — AB (ref 80.0–100.0)
Platelets: 239 10*3/uL (ref 150–440)
RBC: 4.08 MIL/uL (ref 3.80–5.20)
RDW: 16.8 % — ABNORMAL HIGH (ref 11.5–14.5)
WBC: 13.8 10*3/uL — AB (ref 3.6–11.0)

## 2015-09-18 LAB — COMPREHENSIVE METABOLIC PANEL
ALBUMIN: 3 g/dL — AB (ref 3.5–5.0)
ALT: 10 U/L — AB (ref 14–54)
AST: 15 U/L (ref 15–41)
Alkaline Phosphatase: 134 U/L — ABNORMAL HIGH (ref 38–126)
Anion gap: 9 (ref 5–15)
BILIRUBIN TOTAL: 0.3 mg/dL (ref 0.3–1.2)
BUN: 13 mg/dL (ref 6–20)
CO2: 23 mmol/L (ref 22–32)
CREATININE: 0.58 mg/dL (ref 0.44–1.00)
Calcium: 9.4 mg/dL (ref 8.9–10.3)
Chloride: 103 mmol/L (ref 101–111)
GFR calc Af Amer: >60 mL/min (ref >60)
GLUCOSE: 105 mg/dL — AB (ref 65–99)
Potassium: 3.8 mmol/L (ref 3.5–5.1)
Sodium: 135 mmol/L (ref 135–145)
TOTAL PROTEIN: 5.9 g/dL — AB (ref 6.5–8.1)

## 2015-09-18 LAB — VARICELLA ZOSTER ANTIBODY, IGG: Varicella IgG: 523 index (ref >165)

## 2015-09-18 LAB — RPR: RPR: NONREACTIVE

## 2015-09-18 MED ORDER — FAMOTIDINE 20 MG PO TABS
20.0000 mg | ORAL_TABLET | Freq: Every day | ORAL | Status: DC
  Administered 2015-09-18: 20 mg via ORAL
  Filled 2015-09-18: qty 1

## 2015-09-18 NOTE — Clinical Social Work Maternal (Signed)
  CLINICAL SOCIAL WORK MATERNAL/CHILD NOTE  Patient Details  Name: Kayla Howell: [id number] Date of Birth: June 13, 1980  Date:  09/18/2015  Clinical Social Worker Initiating Note:  [name] MSW,LCSW Date/ Time Initiated:  09/18/15/1100     Child's Name:      Legal Guardian:  Mother   Need for Interpreter:  None   Date of Referral:  09/17/15     Reason for Referral:   (Consult received for little to no prenatal care)   Referral Source:  Physician   Address:     Phone number:      Household Members:  Minor Children, Parents   Natural Supports (not living in the home):      Professional Supports: None   Employment: Unemployed   Type of Work:     Education:      Architectinancial Resources:  [employer]   Other Resources:  [employer]   Cultural/Religious Considerations Which May Impact Care:  none  Strengths:  Ability to meet basic needs , Compliance with medical plan , Home prepared for child    Risk Factors/Current Problems:  None   Cognitive State:  Alert    Mood/Affect:  Calm    CSW Assessment: CSW consulted by MD for little to no prenatal care. There is not much that can be done to intervene regarding prenatal care, now that the patient has had her newborn. CSW spoke with patient this morning regarding support systems and resources. Patient states that she has 6 other children in the home with her ranging from 601 1/2 to 35 years old. Patient states that this is her 7th baby. Patient states she was working however she had some difficulty with this pregnancy and could not work in her last trimester. Patient states her mother lives with her and is a big source of support to her and her children. Patient stated that the father of baby will be involved with this newborn. Patient reports that she has transportation and has all necessities for her newborn. In regards to her history of postpartum depression, she stated that she does not take medication for history of depression and  that she is aware of the signs and symptoms and her mother is as well. Patient expresses no needs or concerns at this time.  CSW Plan/Description:  Psychosocial Support and Ongoing Assessment of Needs    [name], LCSW 09/18/2015, 11:03 AM

## 2015-09-18 NOTE — Progress Notes (Signed)
Post Partum Day 1 Subjective: no complaints  Objective: Blood pressure 145/86, pulse 76, temperature 98.5 F (36.9 C), temperature source Oral, resp. rate 20, height 5\' 10"  (1.778 m), weight 200 lb (90.719 kg), SpO2 97 %, unknown if currently breastfeeding.  Physical Exam:  General: alert and cooperative Lochia: appropriate Uterine Fundus: firm Incision: n/a DVT Evaluation: No evidence of DVT seen on physical exam. Lungs CTA  CV rrr  Recent Labs  09/16/15 2321 09/18/15 0550  HGB 10.0* 9.2*  HCT 30.8* 29.2*    Assessment/Plan: Plan for discharge tomorrow   LOS: 2 days   Kayla Howell 09/18/2015, 10:08 AM

## 2015-09-18 NOTE — Anesthesia Postprocedure Evaluation (Signed)
Anesthesia Post Note  Patient: Kayla Howell  Procedure(s) Performed: * No procedures listed *  Patient location during evaluation: Mother Baby Anesthesia Type: Epidural Level of consciousness: awake and awake and alert Pain management: pain level controlled Vital Signs Assessment: post-procedure vital signs reviewed and stable Respiratory status: spontaneous breathing and nonlabored ventilation Cardiovascular status: blood pressure returned to baseline and stable Postop Assessment: no headache, no backache, no signs of nausea or vomiting and patient able to bend at knees Anesthetic complications: no    Last Vitals:  Filed Vitals:   09/17/15 1924 09/18/15 0105  BP: 135/89 123/74  Pulse: 80 65  Temp: 36.9 C 36.9 C  Resp: 18 18    Last Pain:  Filed Vitals:   09/18/15 0248  PainSc: 5                  Chiropodisttephanie Lakshmi Sundeen

## 2015-09-19 LAB — PROTEIN / CREATININE RATIO, URINE
CREATININE, URINE: 70 mg/dL
Protein Creatinine Ratio: 0.11 mg/mg{Cre} (ref 0.00–0.15)
TOTAL PROTEIN, URINE: 8 mg/dL

## 2015-09-19 MED ORDER — RANITIDINE HCL 150 MG PO TABS
150.0000 mg | ORAL_TABLET | Freq: Two times a day (BID) | ORAL | Status: DC
Start: 2015-09-19 — End: 2018-04-21

## 2015-09-19 MED ORDER — IBUPROFEN 600 MG PO TABS: 600.0000 mg | ORAL_TABLET | Freq: Four times a day (QID) | ORAL | Status: DC

## 2015-09-19 NOTE — Discharge Summary (Signed)
Obstetric Discharge Summary Reason for Admission: onset of labor Prenatal Procedures: none Intrapartum Procedures: spontaneous vaginal delivery Postpartum Procedures: none Complications-Operative and Postpartum: none HEMOGLOBIN  Date Value Ref Range Status  09/18/2015 9.2* 12.0 - 16.0 g/dL Final   HGB  Date Value Ref Range Status  08/08/2014 10.6* 12.0-16.0 g/dL Final   HCT  Date Value Ref Range Status  09/18/2015 29.2* 35.0 - 47.0 % Final  08/10/2014 29.1* 35.0-47.0 % Final    Physical Exam:  General: alert and cooperative Lochia: appropriate Uterine Fundus: firm Incision:n/a DVT Evaluation: No evidence of DVT seen on physical exam.  Discharge Diagnoses: Term Pregnancy-delivered  Discharge Information: Date: 09/19/2015 Activity: pelvic rest Diet: routine Medications: Ibuprofen and zantac Condition: stable Instructions: refer to practice specific booklet Discharge to: home Follow-up Information    Follow up with Christeen DouglasBEASLEY, BETHANY, MD In 4 weeks.   Specialty:  Obstetrics and Gynecology   Why:  discuss BTL   Contact information:   [address] [place] [id number] [phone]       Newborn Data: Live born female  Birth Weight: 7 lb 4.8 oz (3310 g) APGAR: 8, 9  Home with mother.  Kalden Wanke 09/19/2015, 9:39 AM

## 2015-09-19 NOTE — Progress Notes (Signed)
Patient understands all discharge instructions and the need to make follow up appointments. Patient discharge via wheelchair with auxillary. 

## 2015-09-19 NOTE — Discharge Instructions (Signed)
Bleeding: Your bleeding could continue up to 6 weeks, the flow should gradually decrease and the color should become dark then lightened over the next couple of weeks. If you notice you are bleeding heavily or passing clots larger than the size of your fist, PLEASE call your physician. No TAMPONS, DOUCHING, ENEMAS OR SEXUAL INTERCOURSE for 6 weeks.  ° °Stitches: Shower daily with mild soap and water. Stitches will dissolve over the next couple of weeks, if you experience any discomfort in the vaginal area you may sit in warm water 15-20 minutes, 3-4 times per day. Just enough water to cover vaginal area.  ° °AfterPains: This is the uterus contracting back to its normal position and size. Use medications prescribed or recommended by your physician to help relieve this discomfort.  ° °Bowels/Hemorrhoids: Drink plenty of water and stay active. Increase fiber, fresh fruits and vegetables in your diet.  ° °Rest/Activity: Rest when the baby is resting ° °Bathing: Shower daily! ° °Diet: Continue to eat extra calories until your follow up visit to help replenish nutrients and vitamins. If breastfeeding eat an extra 500-1000 and increase your fluid intake to 12 glasses a day.  ° °Contraception: Consult with your physician on what method of birth control you would like to use.  ° °Postpartum "BLUES": It is common to emotional days after delivery, however if it persist for greater than 2 weeks or if you feel concerned please let your physician know immediately. This is hormone driven and nothing you can control so please let someone know how you feel. ° °Follow Up Visit: Please schedule a follow up visit with your physician.  °

## 2016-11-27 ENCOUNTER — Encounter: Payer: Self-pay | Admitting: Emergency Medicine

## 2016-11-27 ENCOUNTER — Emergency Department
Admission: EM | Admit: 2016-11-27 | Discharge: 2016-11-27 | Disposition: A | Discharge status: Home / Self Care | Payer: Self-pay | Attending: Emergency Medicine | Admitting: Emergency Medicine

## 2016-11-27 DIAGNOSIS — F17200 Nicotine dependence, unspecified, uncomplicated: Secondary | ICD-10-CM | POA: Insufficient documentation

## 2016-11-27 DIAGNOSIS — N898 Other specified noninflammatory disorders of vagina: Secondary | ICD-10-CM | POA: Insufficient documentation

## 2016-11-27 DIAGNOSIS — R1030 Lower abdominal pain, unspecified: Secondary | ICD-10-CM | POA: Insufficient documentation

## 2016-11-27 DIAGNOSIS — Z79899 Other long term (current) drug therapy: Secondary | ICD-10-CM | POA: Insufficient documentation

## 2016-11-27 LAB — WET PREP, GENITAL
CLUE CELLS WET PREP: NONE SEEN
Sperm: NONE SEEN
Trich, Wet Prep: NONE SEEN
YEAST WET PREP: NONE SEEN

## 2016-11-27 LAB — CBC WITH DIFFERENTIAL/PLATELET
BASOS ABS: 0.1 10*3/uL (ref 0–0.1)
Basophils Relative: 1 %
EOS ABS: 0.2 10*3/uL (ref 0–0.7)
Eosinophils Relative: 3 %
HCT: 33.8 % — ABNORMAL LOW (ref 35.0–47.0)
HEMOGLOBIN: 10.6 g/dL — AB (ref 12.0–16.0)
Lymphocytes Relative: 30 %
Lymphs Abs: 2.1 10*3/uL (ref 1.0–3.6)
MCH: 20.6 pg — AB (ref 26.0–34.0)
MCHC: 31.3 g/dL — ABNORMAL LOW (ref 32.0–36.0)
MCV: 65.7 fL — ABNORMAL LOW (ref 80.0–100.0)
Monocytes Absolute: 0.6 10*3/uL (ref 0.2–0.9)
Monocytes Relative: 8 %
NEUTROS PCT: 58 %
Neutro Abs: 3.9 10*3/uL (ref 1.4–6.5)
PLATELETS: 245 10*3/uL (ref 150–440)
RBC: 5.14 MIL/uL (ref 3.80–5.20)
RDW: 17.6 % — ABNORMAL HIGH (ref 11.5–14.5)
WBC: 6.8 10*3/uL (ref 3.6–11.0)

## 2016-11-27 LAB — COMPREHENSIVE METABOLIC PANEL
ALBUMIN: 4.2 g/dL (ref 3.5–5.0)
ALK PHOS: 93 U/L (ref 38–126)
ALT: 15 U/L (ref 14–54)
AST: 17 U/L (ref 15–41)
Anion gap: 7 (ref 5–15)
BUN: 9 mg/dL (ref 6–20)
CALCIUM: 9.3 mg/dL (ref 8.9–10.3)
CHLORIDE: 107 mmol/L (ref 101–111)
CO2: 24 mmol/L (ref 22–32)
CREATININE: 0.55 mg/dL (ref 0.44–1.00)
GFR calc non Af Amer: >60 mL/min (ref >60)
GLUCOSE: 114 mg/dL — AB (ref 65–99)
Potassium: 4.3 mmol/L (ref 3.5–5.1)
SODIUM: 138 mmol/L (ref 135–145)
Total Bilirubin: 0.5 mg/dL (ref 0.3–1.2)
Total Protein: 7.6 g/dL (ref 6.5–8.1)

## 2016-11-27 LAB — CHLAMYDIA/NGC RT PCR (ARMC ONLY)
Chlamydia Tr: NOT DETECTED
N GONORRHOEAE: NOT DETECTED

## 2016-11-27 LAB — HCG, QUANTITATIVE, PREGNANCY: hCG, Beta Chain, Quant, S: <1 m[IU]/mL (ref <5)

## 2016-11-27 LAB — URINALYSIS, ROUTINE W REFLEX MICROSCOPIC
Bilirubin Urine: NEGATIVE
Glucose, UA: NEGATIVE mg/dL
Hgb urine dipstick: NEGATIVE
Ketones, ur: NEGATIVE mg/dL
LEUKOCYTES UA: NEGATIVE
Nitrite: NEGATIVE
PROTEIN: NEGATIVE mg/dL
Specific Gravity, Urine: 1.013 (ref 1.005–1.030)
pH: 5 (ref 5.0–8.0)

## 2016-11-27 NOTE — ED Notes (Signed)
Patient states, "I could of sworn I was pregnant but I was at the doctors earlier today and they said I am not". LMP 10/16/16. Patient c/o bloating and abdominal discomfort.

## 2016-11-27 NOTE — ED Provider Notes (Signed)
[facility] Emergency Department Provider Note  Time seen: 3:48 PM  I have reviewed the triage vital signs and the nursing notes.   HISTORY  Chief Complaint Abdominal Pain (sent in to r/o ectopic preg) and Bloated    HPI Kayla Howell is a 36 y.o. female who presents to the emergency department for abdominal discomfort. According to the patient for the past 2 weeks she has been having lower abdominal pain with significant vaginal discharge. She went to the women's clinic in [place] and they performed a pelvic ultrasound which per patient was normal. They also do the urine pregnancy test which was negative. Per patient they said this could represent an ectopic pregnancy and she needed to go to the emergency department because their tests are negative and her test might be different. Patient states mild suprapubic discomfort which has been ongoing for 2 weeks. States she's been having significant vaginal discharge showed a point where she is having to wear pads due to the discharge. Denies any dysuria or hematuria. Denies nausea, vomiting or diarrhea. Does state foul odor to her bowel movements over the past week or 2 but denies any loose bowel movements. Denies any fever. Cyril Mourning negative review of systems.  Past Medical History:  Diagnosis Date  . Medical history non-contributory     Patient Active Problem List   Diagnosis Date Noted  . Pregnancy 09/16/2015    History reviewed. No pertinent surgical history.  Prior to Admission medications   Medication Sig Start Date End Date Taking? Authorizing Provider  ibuprofen (ADVIL,MOTRIN) 600 MG tablet Take 1 tablet (600 mg total) by mouth every 6 (six) hours. 09/19/15   Schermerhorn, Ihor Austin, MD  Prenatal Vit-Fe Fumarate-FA (MULTIVITAMIN-PRENATAL) 27-0.8 MG TABS tablet Take 1 tablet by mouth daily at 12 noon.    [provider]  ranitidine (ZANTAC) 150 MG tablet Take 1 tablet (150 mg total) by mouth 2 (two)  times daily. 09/19/15   Schermerhorn, Ihor Austin, MD    No Known Allergies  No family history on file.  Social History Social History  Substance Use Topics  . Smoking status: Current Every Day Smoker  . Smokeless tobacco: Never Used  . Alcohol use No    Review of Systems Constitutional: Negative for fever. Cardiovascular: Negative for chest pain. Respiratory: Negative for shortness of breath. Gastrointestinal: Suprapubic discomfort. Negative for nausea, vomiting, diarrhea Genitourinary: Negative for dysuria. Positive for vaginal discharge. Musculoskeletal: Negative for back pain. Neurological: Negative for headache All other ROS negative  ____________________________________________   PHYSICAL EXAM:  VITAL SIGNS: ED Triage Vitals  Enc Vitals Group     BP 11/27/16 1346 (!) 141/79     Pulse Rate 11/27/16 1346 93     Resp 11/27/16 1346 20     Temp 11/27/16 1346 98.2 F (36.8 C)     Temp Source 11/27/16 1346 Oral     SpO2 11/27/16 1346 99 %     Weight 11/27/16 1346 190 lb (86.2 kg)     Height 11/27/16 1346 5\' 10"  (1.778 m)     Head Circumference --      Peak Flow --      Pain Score 11/27/16 1344 6     Pain Loc --      Pain Edu? --      Excl. in GC? --     Constitutional: Alert and oriented. Well appearing and in no distress. Eyes: Normal exam ENT   Head: Normocephalic and atraumatic.   Mouth/Throat:  Mucous membranes are moist. Cardiovascular: Normal rate, regular rhythm. No murmur Respiratory: Normal respiratory effort without tachypnea nor retractions. Breath sounds are clear Gastrointestinal: Soft, moderate suprapubic tenderness palpation otherwise benign abdomen. No rebound or guarding. No distention. Musculoskeletal: Nontender with normal range of motion in all extremities.  Neurologic:  Normal speech and language. No gross focal neurologic deficits  Skin:  Skin is warm, dry and intact.  Psychiatric: Mood and affect are  normal.  ____________________________________________   INITIAL IMPRESSION / ASSESSMENT AND PLAN / ED COURSE  Pertinent labs & imaging results that were available during my care of the patient were reviewed by me and considered in my medical decision making (see chart for details).  Patient presents for lower abdominal discomfort with significant vaginal discharge. Patient's labs including quantitative beta hCG is negative. Urinalysis is negative. We'll perform a pelvic examination of patient agreeable to plan.  Patient's pelvic exam shows a mild amount of vaginal discharge. Nontender with no significant adnexal or cervical motion tenderness. Wet prep shows few white blood cells. Does not appear to be at significant risk for STDs with minimal discharge we will hold off on treatment until gonorrhea/chlamydia testing is known. Patient has essentially had a normal workup with a fairly nontender abdomen besides mild suprapubic tenderness. We will discharge patient home with PCP follow-up. Patient agreeable plan.  ____________________________________________   FINAL CLINICAL IMPRESSION(S) / ED DIAGNOSES  Lower abdominal discomfort Vaginal discharge    Minna Antis, MD 11/27/16 1717

## 2016-11-27 NOTE — ED Triage Notes (Signed)
First Nurse Note:  Arrives today from PCP for evaluation of recent history of abdominal pain and swelling.  States she had a pelvic ultrasound and urine test at PCP -- states urine preg was negative at PCP, but was sent to ED to r/o ectopic pregnancy.  AAOx3.  Skin warm and dry.  Ambulates with easy and steady gait.  NAD

## 2017-10-24 ENCOUNTER — Other Ambulatory Visit: Payer: Self-pay

## 2017-10-24 ENCOUNTER — Emergency Department
Admission: EM | Admit: 2017-10-24 | Discharge: 2017-10-24 | Disposition: A | Discharge status: Home / Self Care | Payer: Self-pay | Attending: Emergency Medicine | Admitting: Emergency Medicine

## 2017-10-24 DIAGNOSIS — Z3202 Encounter for pregnancy test, result negative: Secondary | ICD-10-CM | POA: Insufficient documentation

## 2017-10-24 DIAGNOSIS — N390 Urinary tract infection, site not specified: Secondary | ICD-10-CM | POA: Insufficient documentation

## 2017-10-24 DIAGNOSIS — F17200 Nicotine dependence, unspecified, uncomplicated: Secondary | ICD-10-CM | POA: Insufficient documentation

## 2017-10-24 LAB — LIPASE, BLOOD: LIPASE: 25 U/L (ref 11–51)

## 2017-10-24 LAB — CBC
HCT: 33.1 % — ABNORMAL LOW (ref 35.0–47.0)
Hemoglobin: 10.3 g/dL — ABNORMAL LOW (ref 12.0–16.0)
MCH: 20.2 pg — AB (ref 26.0–34.0)
MCHC: 31.2 g/dL — AB (ref 32.0–36.0)
MCV: 64.7 fL — AB (ref 80.0–100.0)
PLATELETS: 205 10*3/uL (ref 150–440)
RBC: 5.11 MIL/uL (ref 3.80–5.20)
RDW: 17.9 % — AB (ref 11.5–14.5)
WBC: 4.2 10*3/uL (ref 3.6–11.0)

## 2017-10-24 LAB — URINALYSIS, COMPLETE (UACMP) WITH MICROSCOPIC
Bacteria, UA: NONE SEEN
Bilirubin Urine: NEGATIVE
Glucose, UA: NEGATIVE mg/dL
Hgb urine dipstick: NEGATIVE
KETONES UR: NEGATIVE mg/dL
Leukocytes, UA: NEGATIVE
Nitrite: POSITIVE — AB
PH: 5 (ref 5.0–8.0)
Protein, ur: 30 mg/dL — AB
SPECIFIC GRAVITY, URINE: 1.03 (ref 1.005–1.030)

## 2017-10-24 LAB — COMPREHENSIVE METABOLIC PANEL
ALK PHOS: 85 U/L (ref 38–126)
ALT: 27 U/L (ref 0–44)
AST: 34 U/L (ref 15–41)
Albumin: 4.1 g/dL (ref 3.5–5.0)
Anion gap: 9 (ref 5–15)
BUN: 11 mg/dL (ref 6–20)
CALCIUM: 8.9 mg/dL (ref 8.9–10.3)
CHLORIDE: 108 mmol/L (ref 98–111)
CO2: 21 mmol/L — ABNORMAL LOW (ref 22–32)
CREATININE: 0.54 mg/dL (ref 0.44–1.00)
Glucose, Bld: 126 mg/dL — ABNORMAL HIGH (ref 70–99)
Potassium: 3.4 mmol/L — ABNORMAL LOW (ref 3.5–5.1)
Sodium: 138 mmol/L (ref 135–145)
Total Bilirubin: 0.3 mg/dL (ref 0.3–1.2)
Total Protein: 7.5 g/dL (ref 6.5–8.1)

## 2017-10-24 LAB — POCT PREGNANCY, URINE: Preg Test, Ur: NEGATIVE

## 2017-10-24 MED ORDER — SULFAMETHOXAZOLE-TRIMETHOPRIM 800-160 MG PO TABS: 1.0000 | ORAL_TABLET | Freq: Two times a day (BID) | ORAL | 0 refills | Status: DC

## 2017-10-24 MED ORDER — PHENAZOPYRIDINE HCL 100 MG PO TABS
95.0000 mg | ORAL_TABLET | Freq: Once | ORAL | Status: AC
  Administered 2017-10-24: 100 mg via ORAL
  Filled 2017-10-24: qty 1

## 2017-10-24 MED ORDER — PHENAZOPYRIDINE HCL 200 MG PO TABS
ORAL_TABLET | ORAL | Status: AC
  Filled 2017-10-24: qty 1

## 2017-10-24 MED ORDER — SULFAMETHOXAZOLE-TRIMETHOPRIM 800-160 MG PO TABS
1.0000 | ORAL_TABLET | Freq: Once | ORAL | Status: AC
  Administered 2017-10-24: 1 via ORAL
  Filled 2017-10-24: qty 1

## 2017-10-24 MED ORDER — PHENAZOPYRIDINE HCL 200 MG PO TABS: 200.0000 mg | ORAL_TABLET | Freq: Three times a day (TID) | ORAL | 0 refills | Status: DC | PRN

## 2017-10-24 NOTE — ED Triage Notes (Addendum)
Pt arrives to ED c/o of dysuria intermittently x few weeks. Also c/o diarrhea and nausea. Denies vomiting. Alert and oriented. Ambulatory. Also c/o frequent urination. C/o burning sensation in abd and bloating when trying to eat. Has tried azo and cranberry juice. Denies fevers.

## 2017-10-24 NOTE — ED Notes (Signed)
See triage note  Presents with possible UTI sx's  States sx's started about 2 weeks ago  Unsure of fever but has had "hot" flashes

## 2017-10-24 NOTE — ED Provider Notes (Signed)
[facility] Emergency Department Provider Note   ____________________________________________   First MD Initiated Contact with Patient 10/24/17 1458     (approximate)  I have reviewed the triage vital signs and the nursing notes.   HISTORY  Chief Complaint Dysuria and Diarrhea    HPI Kayla Howell is a 37 y.o. female patient complaining of dysuria intermittently for few weeks.  Patient denies vaginal discharge or flank pain.  Patient denies fever.  Patient also complain of diarrhea nausea.  Patient denies vomiting.  Patient state at times she does have intermittent abdominal bloating.  Patient had no relief taking Azo and cranberry juice.  Patient rates the pain discomfort as a 7/10.  Past Medical History:  Diagnosis Date  . Medical history non-contributory     Patient Active Problem List   Diagnosis Date Noted  . Pregnancy 09/16/2015    History reviewed. No pertinent surgical history.  Prior to Admission medications   Medication Sig Start Date End Date Taking? Authorizing Provider  ibuprofen (ADVIL,MOTRIN) 600 MG tablet Take 1 tablet (600 mg total) by mouth every 6 (six) hours. Patient not taking: Reported on 11/27/2016 09/19/15   Schermerhorn, Ihor Austin, MD  phenazopyridine (PYRIDIUM) 200 MG tablet Take 1 tablet (200 mg total) by mouth 3 (three) times daily as needed for pain. 10/24/17   Joni Reining, PA-C  ranitidine (ZANTAC) 150 MG tablet Take 1 tablet (150 mg total) by mouth 2 (two) times daily. Patient not taking: Reported on 11/27/2016 09/19/15   Schermerhorn, Ihor Austin, MD  sulfamethoxazole-trimethoprim (BACTRIM DS,SEPTRA DS) 800-160 MG tablet Take 1 tablet by mouth 2 (two) times daily. 10/24/17   Joni Reining, PA-C    Allergies Patient has no known allergies.  History reviewed. No pertinent family history.  Social History Social History   Tobacco Use  . Smoking status: Current Every Day Smoker  . Smokeless tobacco: Never Used    Substance Use Topics  . Alcohol use: No  . Drug use: No    Review of Systems Constitutional: No fever/chills Eyes: No visual changes. ENT: No sore throat. Cardiovascular: Denies chest pain. Respiratory: Denies shortness of breath. Gastrointestinal: No abdominal pain.  Nausea without vomiting.  Diarrhea.  No constipation. Genitourinary: Positive for dysuria. Musculoskeletal: Negative for back pain. Skin: Negative for rash. Neurological: Negative for headaches, focal weakness or numbness.   ____________________________________________   PHYSICAL EXAM:  VITAL SIGNS: ED Triage Vitals  Enc Vitals Group     BP 10/24/17 1300 140/78     Pulse Rate 10/24/17 1300 95     Resp 10/24/17 1300 14     Temp 10/24/17 1300 98.1 F (36.7 C)     Temp Source 10/24/17 1300 Oral     SpO2 10/24/17 1300 99 %     Weight 10/24/17 1300 187 lb (84.8 kg)     Height 10/24/17 1300 5\' 10"  (1.778 m)     Head Circumference --      Peak Flow --      Pain Score 10/24/17 1303 7     Pain Loc --      Pain Edu? --      Excl. in GC? --    Constitutional: Alert and oriented. Well appearing and in no acute distress.  No cervical lymphadenopathy. Cardiovascular: Normal rate, regular rhythm. Grossly normal heart sounds.  Good peripheral circulation. Respiratory: Normal respiratory effort.  No retractions. Lungs CTAB. Gastrointestinal: Soft and nontender. No distention. No abdominal bruits. No CVA tenderness. Neurologic:  Normal speech and language. No gross focal neurologic deficits are appreciated. No gait instability. Skin:  Skin is warm, dry and intact. No rash noted. Psychiatric: Mood and affect are normal. Speech and behavior are normal.  ____________________________________________   LABS (all labs ordered are listed, but only abnormal results are displayed)  Labs Reviewed  COMPREHENSIVE METABOLIC PANEL - Abnormal; Notable for the following components:      Result Value   Potassium 3.4 (*)     CO2 21 (*)    Glucose, Bld 126 (*)    All other components within normal limits  CBC - Abnormal; Notable for the following components:   Hemoglobin 10.3 (*)    HCT 33.1 (*)    MCV 64.7 (*)    MCH 20.2 (*)    MCHC 31.2 (*)    RDW 17.9 (*)    All other components within normal limits  URINALYSIS, COMPLETE (UACMP) WITH MICROSCOPIC - Abnormal; Notable for the following components:   Color, Urine AMBER (*)    APPearance CLEAR (*)    Protein, ur 30 (*)    Nitrite POSITIVE (*)    All other components within normal limits  LIPASE, BLOOD  POC URINE PREG, ED  POCT PREGNANCY, URINE   ____________________________________________  EKG   ____________________________________________  RADIOLOGY  ED MD interpretation:    Official radiology report(s): No results found.  ____________________________________________   PROCEDURES  Procedure(s) performed: None  Procedures  Critical Care performed: No  ____________________________________________   INITIAL IMPRESSION / ASSESSMENT AND PLAN / ED COURSE  As part of my medical decision making, I reviewed the following data within the electronic MEDICAL RECORD NUMBER    Dysuria secondary to urinary tract infection.  Discussed lab results with patient.  Patient given discharge care instruction advised take medication directed.  Patient advised [facility] for [facility]evaluation.      ____________________________________________   FINAL CLINICAL IMPRESSION(S) / ED DIAGNOSES  Final diagnoses:  Lower urinary tract infectious disease     ED Discharge Orders        Ordered    sulfamethoxazole-trimethoprim (BACTRIM DS,SEPTRA DS) 800-160 MG tablet  2 times daily     10/24/17 1505    phenazopyridine (PYRIDIUM) 200 MG tablet  3 times daily PRN     10/24/17 1505       Note:  This document was prepared using Dragon voice recognition software and may include unintentional dictation errors.     Joni ReiningSmith, Harrington Jobe K, PA-C 10/24/17 1511    Nita SickleVeronese, [place], MD 10/24/17 2001

## 2018-04-21 ENCOUNTER — Ambulatory Visit: Payer: Medicaid Other

## 2018-04-21 ENCOUNTER — Other Ambulatory Visit: Payer: Self-pay

## 2018-04-21 ENCOUNTER — Ambulatory Visit
Admission: EM | Admit: 2018-04-21 | Discharge: 2018-04-21 | Disposition: A | Discharge status: Home / Self Care | Payer: Medicaid Other | Attending: Emergency Medicine | Admitting: Emergency Medicine

## 2018-04-21 ENCOUNTER — Encounter: Payer: Self-pay | Admitting: Emergency Medicine

## 2018-04-21 DIAGNOSIS — S63635A Sprain of interphalangeal joint of left ring finger, initial encounter: Secondary | ICD-10-CM | POA: Insufficient documentation

## 2018-04-21 DIAGNOSIS — M79645 Pain in left finger(s): Secondary | ICD-10-CM | POA: Insufficient documentation

## 2018-04-21 DIAGNOSIS — W228XXA Striking against or struck by other objects, initial encounter: Secondary | ICD-10-CM

## 2018-04-21 DIAGNOSIS — Y9372 Activity, wrestling: Secondary | ICD-10-CM

## 2018-04-21 MED ORDER — MELOXICAM 15 MG PO TABS: 15.0000 mg | ORAL_TABLET | Freq: Every day | ORAL | 0 refills | Status: DC | PRN

## 2018-04-21 NOTE — ED Triage Notes (Signed)
Pt c/o left ring finger pain. She was playing with her son and hit it on his cast. incident occurred last night. Finger is swollen and she can not straighten it out.

## 2018-04-21 NOTE — ED Provider Notes (Signed)
[facility] ____________________________________________  Time seen: Approximately 1:31 PM  I have reviewed the triage vital signs and the nursing notes.   HISTORY  Chief Complaint Finger Injury (left ring finger)   HPI Kayla Howell is a 38 y.o. female presenting for evaluation of left ring finger pain since last night.  Patient reports that she was wrestling and playing with her son.  States that her son has a cast on his lower leg and she believes she hit her finger directly on the cast causing the injury.  Reports that initial onset she had a tingling sensation followed by the pain.  Denies any paresthesias at this time, but reports pain has continued.  States the area has become swollen and decrease bending and straightening.  Denies any break in skin.  Reports she is left-hand dominant.  Did apply ice last night as well as take ibuprofen which helps some.  Denies other alleviating measures.  Pain is worse with direct palpation.  Pain is currently mild.  Pain radiates somewhat towards the knuckle, but denies other radiation.  Reports otherwise doing well denies other complaints.  Patient's last menstrual period was 04/03/2018 (approximate).  Denies pregnancy.   Past Medical History:  Diagnosis Date  . Medical history non-contributory     Patient Active Problem List   Diagnosis Date Noted  . Pregnancy 09/16/2015    Past Surgical History:  Procedure Laterality Date  . NO PAST SURGERIES       No current facility-administered medications for this encounter.   Current Outpatient Medications:  .  ibuprofen (ADVIL,MOTRIN) 600 MG tablet, Take 1 tablet (600 mg total) by mouth every 6 (six) hours., Disp: 60 tablet, Rfl: 0 .  meloxicam (MOBIC) 15 MG tablet, Take 1 tablet (15 mg total) by mouth daily as needed., Disp: 10 tablet, Rfl: 0  Allergies Patient has no known allergies.  Family History  Problem Relation Age of Onset  . Healthy Mother   . Healthy Father       Social History Social History   Tobacco Use  . Smoking status: Current Every Day Smoker    Packs/day: 1.00  . Smokeless tobacco: Never Used  Substance Use Topics  . Alcohol use: No  . Drug use: No    Review of Systems Constitutional: No fever Cardiovascular: Denies chest pain. Respiratory: Denies shortness of breath. Gastrointestinal: No abdominal pain.   Musculoskeletal: Negative for back pain.  Positive left ring finger pain. Skin: Negative for rash.   ____________________________________________   PHYSICAL EXAM:  VITAL SIGNS: ED Triage Vitals  Enc Vitals Group     BP 04/21/18 1241 123/82     Pulse Rate 04/21/18 1241 83     Resp 04/21/18 1241 18     Temp 04/21/18 1241 98.2 F (36.8 C)     Temp Source 04/21/18 1241 Oral     SpO2 04/21/18 1241 100 %     Weight 04/21/18 1238 220 lb (99.8 kg)     Height 04/21/18 1238 5\' 10"  (1.778 m)     Head Circumference --      Peak Flow --      Pain Score 04/21/18 1238 7     Pain Loc --      Pain Edu? --      Excl. in [place]? --     Constitutional: Alert and oriented. Well appearing and in no acute distress. ENT      Head: Normocephalic and atraumatic. Cardiovascular: Normal rate, regular rhythm. Grossly normal  heart sounds.  Good peripheral circulation. Respiratory: Normal respiratory effort without tachypnea nor retractions. Breath sounds are clear and equal bilaterally. No wheezes, rales, rhonchi. Musculoskeletal: Bilateral distal radial pulses equal and easily palpated.  Left hand moderate tenderness at proximal phalanx fourth digit and at PIP joint as well as mild tenderness at fourth MCP joint, minimal tenderness at distal fourth metacarpal, good resisted distal flexion and extension to fourth digit but decreased flexion and extension at PIP joint, left hand otherwise nontender.  Left hand normal distal sensation capillary refill to all fingers. Neurologic:  Normal speech and language. Speech is normal. No gait  instability.  Skin:  Skin is warm, dry and intact. No rash noted. Psychiatric: Mood and affect are normal. Speech and behavior are normal. Patient exhibits appropriate insight and judgment   ___________________________________________   LABS (all labs ordered are listed, but only abnormal results are displayed)  Labs Reviewed - No data to display ____________________________________________  RADIOLOGY  Dg Finger Ring Left  Result Date: 04/21/2018 CLINICAL DATA:  Left fourth finger pain after injury wrestling with kids. EXAM: LEFT RING FINGER 2+V COMPARISON:  None. FINDINGS: There is no evidence of fracture or dislocation. There is no evidence of arthropathy or other focal bone abnormality. Soft tissues are unremarkable. IMPRESSION: Negative. Electronically Signed   By: Lupita RaiderJames  Green Jr, M.D.   On: 04/21/2018 13:26   ____________________________________________   PROCEDURES Procedures   INITIAL IMPRESSION / ASSESSMENT AND PLAN / ED COURSE  Pertinent labs & imaging results that were available during my care of the patient were reviewed by me and considered in my medical decision making (see chart for details).  Well-appearing patient.  No acute distress.  Left ring finger x-ray as above per radiologist reviewed by myself, negative.  Suspect sprain and contusion injury.  Fourth and fifth fingers buddy taped and finger splint applied for support for 2 to 3 days.  Discussed stretching, ice, elevation, Mobic.  Follow-up as needed for continued complaints.Discussed indication, risks and benefits of medications with patient.  Discussed follow up with Primary care physician this week. Discussed follow up and return parameters including no resolution or any worsening concerns. Patient verbalized understanding and agreed to plan.   ____________________________________________   FINAL CLINICAL IMPRESSION(S) / ED DIAGNOSES  Final diagnoses:  Finger pain, left  Sprain of interphalangeal joint  of left ring finger, initial encounter     ED Discharge Orders         Ordered    meloxicam (MOBIC) 15 MG tablet  Daily PRN     04/21/18 1344           Note: This dictation was prepared with Dragon dictation along with smaller phrase technology. Any transcriptional errors that result from this process are unintentional.         Renford DillsMiller, Bentlie Catanzaro, NP 04/21/18 1512

## 2018-04-21 NOTE — Discharge Instructions (Addendum)
Take medication as prescribed.  Apply ice.  Use finger splint and buddy tape for at least 2 to 3 days.  Stretch and range of motion.  Follow-up with your primary care or orthopedic as needed for continued pain.  Return to Urgent care for new or worsening concerns.

## 2018-08-28 ENCOUNTER — Encounter: Payer: Self-pay | Admitting: Emergency Medicine

## 2018-08-28 ENCOUNTER — Other Ambulatory Visit: Payer: Self-pay

## 2018-08-28 ENCOUNTER — Ambulatory Visit
Admission: EM | Admit: 2018-08-28 | Discharge: 2018-08-28 | Disposition: A | Discharge status: Home / Self Care | Payer: Medicaid Other | Attending: Family Medicine | Admitting: Family Medicine

## 2018-08-28 DIAGNOSIS — R6889 Other general symptoms and signs: Secondary | ICD-10-CM

## 2018-08-28 DIAGNOSIS — R079 Chest pain, unspecified: Secondary | ICD-10-CM

## 2018-08-28 NOTE — ED Triage Notes (Signed)
Patient c/o intermittent mid sternal chest pain that started 2 days ago. Denies any other symptoms.

## 2018-08-28 NOTE — ED Provider Notes (Signed)
[facility]    CSN: [id number] Arrival date & time: 08/28/18  1228  History   Chief Complaint Chief Complaint  Patient presents with  . Chest Pain   HPI  38 year old female presents with multiple complaints.  Patient reports ongoing weight.  She states that she has not changed her activity level or change her diet.  She is unsure why she is gaining weight.  Patient reports facial flushing and warmth when she is angry.  Patient also reports redness of her toes.  Also has been having dizziness, abdominal pain, and most recently chest pain.  Patient reports a 2-day history of chest pain.  Chest pain is intermittent.  Located centrally.  No reports of shortness of breath.  Patient does note that she is under great deal of stress.  No other associated symptoms.  No known relieving factors.  No other complaints.  History reviewed and updated as below.  PMH: Obesity, Anemia, Tobacco abuse, Hx of UTI, Hx of oligohydramnios  Past Surgical History:  Procedure Laterality Date  . NO PAST SURGERIES      OB History    Gravida  9   Para  7   Term  7   Preterm  0   AB  2   Living  0     SAB  1   TAB  1   Ectopic  0   Multiple  0   Live Births               Home Medications    Prior to Admission medications   Not on File    Family History Family History  Problem Relation Age of Onset  . Healthy Mother   . Healthy Father     Social History Social History   Tobacco Use  . Smoking status: Current Every Day Smoker    Packs/day: 1.00  . Smokeless tobacco: Never Used  Substance Use Topics  . Alcohol use: No  . Drug use: No     Allergies   Patient has no known allergies.   Review of Systems Review of Systems   Physical Exam Triage Vital Signs ED Triage Vitals  Enc Vitals Group     BP 08/28/18 1247 125/90     Pulse Rate 08/28/18 1247 77     Resp 08/28/18 1247 18     Temp 08/28/18 1247 98.4 F (36.9 C)     Temp Source 08/28/18  1247 Oral     SpO2 08/28/18 1247 100 %     Weight 08/28/18 1246 250 lb (113.4 kg)     Height 08/28/18 1246 5\' 10"  (1.778 m)     Head Circumference --      Peak Flow --      Pain Score 08/28/18 1246 7     Pain Loc --      Pain Edu? --      Excl. in [place]? --    No data found.  Updated Vital Signs BP 125/90 (BP Location: Right Arm)   Pulse 77   Temp 98.4 F (36.9 C) (Oral)   Resp 18   Ht 5\' 10"  (1.778 m)   Wt 113.4 kg   LMP 08/21/2018   SpO2 100%   BMI 35.87 kg/m   Visual Acuity Right Eye Distance:   Left Eye Distance:   Bilateral Distance:    Right Eye Near:   Left Eye Near:    Bilateral Near:     Physical Exam  UC Treatments / Results  Labs (all labs ordered are listed, but only abnormal results are displayed) Labs Reviewed - No data to display  EKG Interpretation: Normal sinus rhythm with rate of 77.  Normal axis.  Normal intervals.  No ST or T wave changes.  Normal EKG.  Radiology No results found.  Procedures Procedures (including critical care time)  Medications Ordered in UC Medications - No data to display  Initial Impression / Assessment and Plan / UC Course  I have reviewed the triage vital signs and the nursing notes.  Pertinent labs & imaging results that were available during my care of the patient were reviewed by me and considered in my medical decision making (see chart for details).    38 year old female presents with multiple complaints/ongoing issues including intermittent chest pain.  Chest pain is noncardiac in nature.  EKG normal.  Advised to see PCP. Supportive care.  Final Clinical Impressions(s) / UC Diagnoses   Final diagnoses:  Chest pain, unspecified type  Multiple somatic complaints     Discharge Instructions     Your exam and EKG are normal.  This is likely due to stress/anxiety  Please see your primary care physician.  Take care  Dr. Adriana Simas     ED Prescriptions    None     Controlled Substance  Prescriptions [place] Controlled Substance Registry consulted? Not Applicable   Tommie Sams, DO 08/28/18 1330

## 2018-08-28 NOTE — Discharge Instructions (Signed)
Your exam and EKG are normal.  This is likely due to stress/anxiety  Please see your primary care physician.  Take care  Dr. Adriana Simas

## 2018-11-15 ENCOUNTER — Ambulatory Visit: Payer: Self-pay | Admitting: Physician Assistant

## 2019-06-03 ENCOUNTER — Other Ambulatory Visit: Payer: Self-pay

## 2019-06-03 ENCOUNTER — Emergency Department: Payer: Medicaid Other

## 2019-06-03 ENCOUNTER — Encounter: Payer: Self-pay | Admitting: Emergency Medicine

## 2019-06-03 ENCOUNTER — Emergency Department
Admission: EM | Admit: 2019-06-03 | Discharge: 2019-06-03 | Disposition: A | Discharge status: Home / Self Care | Payer: Medicaid Other | Attending: Emergency Medicine | Admitting: Emergency Medicine

## 2019-06-03 DIAGNOSIS — R300 Dysuria: Secondary | ICD-10-CM | POA: Diagnosis not present

## 2019-06-03 DIAGNOSIS — R079 Chest pain, unspecified: Secondary | ICD-10-CM

## 2019-06-03 DIAGNOSIS — F17210 Nicotine dependence, cigarettes, uncomplicated: Secondary | ICD-10-CM | POA: Diagnosis not present

## 2019-06-03 DIAGNOSIS — R0789 Other chest pain: Secondary | ICD-10-CM | POA: Diagnosis not present

## 2019-06-03 LAB — CBC
HCT: 37.9 % (ref 36.0–46.0)
Hemoglobin: 12.1 g/dL (ref 12.0–15.0)
MCH: 23.5 pg — ABNORMAL LOW (ref 26.0–34.0)
MCHC: 31.9 g/dL (ref 30.0–36.0)
MCV: 73.7 fL — ABNORMAL LOW (ref 80.0–100.0)
Platelets: 251 10*3/uL (ref 150–400)
RBC: 5.14 MIL/uL — ABNORMAL HIGH (ref 3.87–5.11)
RDW: 16.2 % — ABNORMAL HIGH (ref 11.5–15.5)
WBC: 10.2 10*3/uL (ref 4.0–10.5)
nRBC: 0 % (ref 0.0–0.2)

## 2019-06-03 LAB — HEPATIC FUNCTION PANEL
ALT: 18 U/L (ref 0–44)
AST: 15 U/L (ref 15–41)
Albumin: 3.7 g/dL (ref 3.5–5.0)
Alkaline Phosphatase: 92 U/L (ref 38–126)
Bilirubin, Direct: <0.1 mg/dL (ref 0.0–0.2)
Total Bilirubin: 0.6 mg/dL (ref 0.3–1.2)
Total Protein: 7 g/dL (ref 6.5–8.1)

## 2019-06-03 LAB — URINALYSIS, COMPLETE (UACMP) WITH MICROSCOPIC
Bilirubin Urine: NEGATIVE
Glucose, UA: NEGATIVE mg/dL
Hgb urine dipstick: NEGATIVE
Ketones, ur: NEGATIVE mg/dL
Leukocytes,Ua: NEGATIVE
Nitrite: NEGATIVE
Protein, ur: NEGATIVE mg/dL
Specific Gravity, Urine: 1.013 (ref 1.005–1.030)
pH: 6 (ref 5.0–8.0)

## 2019-06-03 LAB — BASIC METABOLIC PANEL
Anion gap: 8 (ref 5–15)
BUN: 8 mg/dL (ref 6–20)
CO2: 22 mmol/L (ref 22–32)
Calcium: 9.7 mg/dL (ref 8.9–10.3)
Chloride: 107 mmol/L (ref 98–111)
Creatinine, Ser: 0.51 mg/dL (ref 0.44–1.00)
GFR calc Af Amer: >60 mL/min (ref >60)
GFR calc non Af Amer: >60 mL/min (ref >60)
Glucose, Bld: 131 mg/dL — ABNORMAL HIGH (ref 70–99)
Potassium: 3.6 mmol/L (ref 3.5–5.1)
Sodium: 137 mmol/L (ref 135–145)

## 2019-06-03 LAB — POCT PREGNANCY, URINE: Preg Test, Ur: NEGATIVE

## 2019-06-03 LAB — FIBRIN DERIVATIVES D-DIMER (ARMC ONLY): Fibrin derivatives D-dimer (ARMC): 273.37 ng/mL (FEU) (ref 0.00–499.00)

## 2019-06-03 LAB — TROPONIN I (HIGH SENSITIVITY)
Troponin I (High Sensitivity): 5 ng/L (ref <18)
Troponin I (High Sensitivity): 3 ng/L (ref <18)

## 2019-06-03 LAB — LIPASE, BLOOD: Lipase: 24 U/L (ref 11–51)

## 2019-06-03 MED ORDER — KETOROLAC TROMETHAMINE 30 MG/ML IJ SOLN
10.0000 mg | Freq: Once | INTRAMUSCULAR | Status: AC
  Administered 2019-06-03: 04:00:00 9.9 mg via INTRAVENOUS
  Filled 2019-06-03: qty 1

## 2019-06-03 MED ORDER — ALUM & MAG HYDROXIDE-SIMETH 200-200-20 MG/5ML PO SUSP
30.0000 mL | Freq: Once | ORAL | Status: AC
  Administered 2019-06-03: 30 mL via ORAL
  Filled 2019-06-03: qty 30

## 2019-06-03 MED ORDER — FAMOTIDINE IN NACL 20-0.9 MG/50ML-% IV SOLN
20.0000 mg | Freq: Once | INTRAVENOUS | Status: AC
  Administered 2019-06-03: 20 mg via INTRAVENOUS
  Filled 2019-06-03: qty 50

## 2019-06-03 MED ORDER — SODIUM CHLORIDE 0.9% FLUSH: 3.0000 mL | Freq: Once | INTRAVENOUS | Status: DC

## 2019-06-03 MED ORDER — FAMOTIDINE 20 MG PO TABS: 20.0000 mg | ORAL_TABLET | Freq: Two times a day (BID) | ORAL | 0 refills | Status: DC

## 2019-06-03 MED ORDER — LIDOCAINE VISCOUS HCL 2 % MT SOLN
15.0000 mL | Freq: Once | OROMUCOSAL | Status: AC
  Administered 2019-06-03: 15 mL via ORAL
  Filled 2019-06-03: qty 15

## 2019-06-03 NOTE — ED Triage Notes (Signed)
Pt reports chest pain for about 2 hrs prior to arrival pt reports she was getting ready to go to bed, when started to feel a stabbing pain middle of chest and felt face really hot, at present reports feels muscles aches all aver her chest area, and neck discomfort, pt talks in complete sentences no distress noted

## 2019-06-03 NOTE — ED Provider Notes (Signed)
Kayla Howell Note   ____________________________________________   First MD Initiated Contact with Patient 06/03/19 0335     (approximate)  I have reviewed the triage vital signs and the nursing notes.   HISTORY  Chief Complaint Chest Pain and Urinary Tract Infection    HPI Kayla Howell is a 39 y.o. female who presents to the ED from home with a chief complaint of chest pain.  Patient reports a 1 month history of chest pain which became worse tonight.  Describes stabbing pain to her center chest spreading to her abdomen, back and neck.  Feels like her chest muscles are sore.  Also feeling of burning/indigestion.  Also reports dysuria.  Having trouble with UTI and yeast infection since IUD placement in October 2020.  Denies fever, cough, shortness of breath, nausea, vomiting or diarrhea.  Denies recent travel or trauma.       Past Medical History:  Diagnosis Date  . Medical history non-contributory     Patient Active Problem List   Diagnosis Date Noted  . Pregnancy 09/16/2015    Past Surgical History:  Procedure Laterality Date  . NO PAST SURGERIES      Prior to Admission medications   Medication Sig Start Date End Date Taking? Authorizing Howell  famotidine (PEPCID) 20 MG tablet Take 1 tablet (20 mg total) by mouth 2 (two) times daily. 06/03/19   Paulette Blanch, MD    Allergies Patient has no known allergies.  Family History  Problem Relation Age of Onset  . Healthy Mother   . Healthy Father     Social History Social History   Tobacco Use  . Smoking status: Current Every Day Smoker    Packs/day: 1.00  . Smokeless tobacco: Never Used  Substance Use Topics  . Alcohol use: No  . Drug use: No    Review of Systems  Constitutional: No fever/chills Eyes: No visual changes. ENT: No sore throat. Cardiovascular: Positive for chest pain. Respiratory: Denies shortness of breath. Gastrointestinal: No  abdominal pain.  No nausea, no vomiting.  No diarrhea.  No constipation. Genitourinary: Positive for dysuria. Musculoskeletal: Negative for back pain. Skin: Negative for rash. Neurological: Negative for headaches, focal weakness or numbness.   ____________________________________________   PHYSICAL EXAM:  VITAL SIGNS: ED Triage Vitals  Enc Vitals Group     BP 06/03/19 0157 (!) 165/86     Pulse Rate 06/03/19 0157 98     Resp 06/03/19 0157 20     Temp 06/03/19 0157 98.5 F (36.9 C)     Temp Source 06/03/19 0157 Oral     SpO2 06/03/19 0157 96 %     Weight 06/03/19 0159 236 lb (107 kg)     Height 06/03/19 0159 5\' 10"  (1.778 m)     Head Circumference --      Peak Flow --      Pain Score 06/03/19 0158 7     Pain Loc --      Pain Edu? --      Excl. in [place]? --     Constitutional: Alert and oriented. Well appearing and in no acute distress. Eyes: Conjunctivae are normal. PERRL. EOMI. Head: Atraumatic. Nose: No congestion/rhinnorhea. Mouth/Throat: Mucous membranes are moist.  Oropharynx non-erythematous. Neck: No stridor.   Cardiovascular: Normal rate, regular rhythm. Grossly normal heart sounds.  Good peripheral circulation. Respiratory: Normal respiratory effort.  No retractions. Lungs CTAB.  Anterior chest wall tender to palpation. Gastrointestinal: Soft and nontender.  No distention. No abdominal bruits. No CVA tenderness. Musculoskeletal: No lower extremity tenderness nor edema.  No joint effusions. Neurologic:  Normal speech and language. No gross focal neurologic deficits are appreciated. No gait instability. Skin:  Skin is warm, dry and intact. No rash noted. Psychiatric: Mood and affect are normal. Speech and behavior are normal.  ____________________________________________   LABS (all labs ordered are listed, but only abnormal results are displayed)  Labs Reviewed  BASIC METABOLIC PANEL - Abnormal; Notable for the following components:      Result Value   Glucose,  Bld 131 (*)    All other components within normal limits  CBC - Abnormal; Notable for the following components:   RBC 5.14 (*)    MCV 73.7 (*)    MCH 23.5 (*)    RDW 16.2 (*)    All other components within normal limits  URINALYSIS, COMPLETE (UACMP) WITH MICROSCOPIC - Abnormal; Notable for the following components:   Color, Urine YELLOW (*)    APPearance CLEAR (*)    Bacteria, UA RARE (*)    All other components within normal limits  URINE CULTURE  FIBRIN DERIVATIVES D-DIMER ([facility] ONLY)  HEPATIC FUNCTION PANEL  LIPASE, BLOOD  POC URINE PREG, ED  POCT PREGNANCY, URINE  TROPONIN I (HIGH SENSITIVITY)  TROPONIN I (HIGH SENSITIVITY)   ____________________________________________  EKG  ED ECG REPORT I, Tuesday Terlecki J, the attending physician, personally viewed and interpreted this ECG.   Date: 06/03/2019  EKG Time: 0156  Rate: 98  Rhythm: normal EKG, normal sinus rhythm  Axis: Normal  Intervals:none  ST&T Change: Nonspecific  ____________________________________________  RADIOLOGY  ED MD interpretation:  atelectasis  Official radiology report(s): DG Chest 2 View  Result Date: 06/03/2019 CLINICAL DATA:  Chest pain. EXAM: CHEST - 2 VIEW COMPARISON:  01/31/2014 FINDINGS: The cardiomediastinal contours are normal. Minor streaky bibasilar atelectasis. Pulmonary vasculature is normal. No consolidation, pleural effusion, or pneumothorax. No acute osseous abnormalities are seen. IMPRESSION: Minor bibasilar atelectasis. Electronically Signed   By: Narda Rutherford M.D.   On: 06/03/2019 02:23    ____________________________________________   PROCEDURES  Procedure(s) performed (including Critical Care):  Procedures   ____________________________________________   INITIAL IMPRESSION / ASSESSMENT AND PLAN / ED COURSE  As part of my medical decision making, I reviewed the following data within the electronic MEDICAL RECORD NUMBER Nursing notes reviewed and incorporated, Labs  reviewed, EKG interpreted, Old chart reviewed, Radiograph reviewed and Notes from prior ED visits     Kayla Howell was evaluated in Emergency Department on 06/03/2019 for the symptoms described in the history of present illness. She was evaluated in the context of the global COVID-19 pandemic, which necessitated consideration that the patient might be at risk for infection with the SARS-CoV-2 virus that causes COVID-19. Institutional protocols and algorithms that pertain to the evaluation of patients at risk for COVID-19 are in a state of rapid change based on information released by regulatory bodies including the CDC and federal and state organizations. These policies and algorithms were followed during the patient's care in the ED.    39 year old female who presents with a 1 month history of chest pain. Differential diagnosis includes, but is not limited to, ACS, aortic dissection, pulmonary embolism, cardiac tamponade, pneumothorax, pneumonia, pericarditis, myocarditis, GI-related causes including esophagitis/gastritis, and musculoskeletal chest wall pain.    First troponin is unremarkable.  Will repeat troponin, check D-dimer, LFTs and lipase.  Administer Toradol, Pepcid and GI cocktail.   Clinical Course as of Jun 02 1446  Sat Jun 03, 2019  1856 Updated patient on all test results.  She is improved.  Strict return precautions given.  Patient verbalizes understanding and agrees with plan of care.   [JS]    Clinical Course User Index [JS] Irean Hong, MD     ____________________________________________   FINAL CLINICAL IMPRESSION(S) / ED DIAGNOSES  Final diagnoses:  Chest pain, unspecified type  Chest wall pain     ED Discharge Orders         Ordered    famotidine (PEPCID) 20 MG tablet  2 times daily     06/03/19 0535           Note:  This document was prepared using Dragon voice recognition software and may include unintentional dictation errors.   Irean Hong,  MD 06/03/19 [phone]

## 2019-06-03 NOTE — Discharge Instructions (Signed)
Start Pepcid 20 mg twice daily.  Return to the ER for worsening symptoms, persistent vomiting, difficulty breathing or other concerns. 

## 2019-06-03 NOTE — ED Notes (Signed)
Pt also reports that she is having urinary tract infection symptoms.

## 2019-06-04 LAB — URINE CULTURE

## 2019-06-05 ENCOUNTER — Telehealth: Payer: Medicaid Other | Admitting: Physician Assistant

## 2019-06-05 DIAGNOSIS — N3000 Acute cystitis without hematuria: Secondary | ICD-10-CM | POA: Diagnosis not present

## 2019-06-05 DIAGNOSIS — B9689 Other specified bacterial agents as the cause of diseases classified elsewhere: Secondary | ICD-10-CM

## 2019-06-05 DIAGNOSIS — N760 Acute vaginitis: Secondary | ICD-10-CM

## 2019-06-05 MED ORDER — METRONIDAZOLE 0.75 % VA GEL: 1.0000 | Freq: Two times a day (BID) | VAGINAL | 0 refills | Status: DC

## 2019-06-05 MED ORDER — CEPHALEXIN 500 MG PO CAPS: 500.0000 mg | ORAL_CAPSULE | Freq: Two times a day (BID) | ORAL | 0 refills | Status: DC

## 2019-06-05 MED ORDER — CLINDAMYCIN PHOSPHATE 2 % VA CREA: 1.0000 | TOPICAL_CREAM | Freq: Every day | VAGINAL | 0 refills | Status: DC

## 2019-06-05 NOTE — Progress Notes (Signed)
We are sorry that you are not feeling well.  Here is how we plan to help!  Based on what you shared with me it looks like you most likely have a simple urinary tract infection.  A UTI (Urinary Tract Infection) is a bacterial infection of the bladder.  Most cases of urinary tract infections are simple to treat but a key part of your care is to encourage you to drink plenty of fluids and watch your symptoms carefully.  I have prescribed Keflex 500 mg twice a day for 7 days.  Your symptoms should gradually improve. Call us if the burning in your urine worsens, you develop worsening fever, back pain or pelvic pain or if your symptoms do not resolve after completing the antibiotic.  Urinary tract infections can be prevented by drinking plenty of water to keep your body hydrated.  Also be sure when you wipe, wipe from front to back and don't hold it in!  If possible, empty your bladder every 4 hours.  Your e-visit answers were reviewed by a board certified advanced clinical practitioner to complete your personal care plan.  Depending on the condition, your plan could have included both over the counter or prescription medications.  If there is a problem please reply  once you have received a response from your provider.  Your safety is important to [place].  If you have drug allergies check your prescription carefully.    You can use MyChart to ask questions about today's visit, request a non-urgent call back, or ask for a work or school excuse for 24 hours related to this e-Visit. If it has been greater than 24 hours you will need to follow up with your provider, or enter a new e-Visit to address those concerns.   You will get an e-mail in the next two days asking about your experience.  I hope that your e-visit has been valuable and will speed your recovery. Thank you for using e-visits.   We are sorry that you are not feeling well. Here is how we plan to help! Based on what you shared with me it  looks like you: May have a vaginosis due to bacteria  Vaginosis is an inflammation of the vagina that can result in discharge, itching and pain. The cause is usually a change in the normal balance of vaginal bacteria or an infection. Vaginosis can also result from reduced estrogen levels after menopause.  The most common causes of vaginosis are:   Bacterial vaginosis which results from an overgrowth of one on several organisms that are normally present in your vagina.   Yeast infections which are caused by a naturally occurring fungus called candida.   Vaginal atrophy (atrophic vaginosis) which results from the thinning of the vagina from reduced estrogen levels after menopause.   Trichomoniasis which is caused by a parasite and is commonly transmitted by sexual intercourse.  Factors that increase your risk of developing vaginosis include: Marland Kitchen Medications, such as antibiotics and steroids . Uncontrolled diabetes . Use of hygiene products such as bubble bath, vaginal spray or vaginal deodorant . Douching . Wearing damp or tight-fitting clothing . Using an intrauterine device (IUD) for birth control . Hormonal changes, such as those associated with pregnancy, birth control pills or menopause . Sexual activity . Having a sexually transmitted infection  Your treatment plan is Clindamycin vaginal cream 5 grams applied vaginally for 7 days.  I have electronically sent this prescription into the pharmacy that you have chosen.  Be sure to take all of the medication as directed. Stop taking any medication if you develop a rash, tongue swelling or shortness of breath. Mothers who are breast feeding should consider pumping and discarding their breast milk while on these antibiotics. However, there is no consensus that infant exposure at these doses would be harmful.  Remember that medication creams can weaken latex condoms. Marland Kitchen   HOME CARE:  Good hygiene may prevent some types of vaginosis from  recurring and may relieve some symptoms:  . Avoid baths, hot tubs and whirlpool spas. Rinse soap from your outer genital area after a shower, and dry the area well to prevent irritation. Don't use scented or harsh soaps, such as those with deodorant or antibacterial action. Marland Kitchen Avoid irritants. These include scented tampons and pads. . Wipe from front to back after using the toilet. Doing so avoids spreading fecal bacteria to your vagina.  Other things that may help prevent vaginosis include:  Marland Kitchen Don't douche. Your vagina doesn't require cleansing other than normal bathing. Repetitive douching disrupts the normal organisms that reside in the vagina and can actually increase your risk of vaginal infection. Douching won't clear up a vaginal infection. . Use a latex condom. Both female and female latex condoms may help you avoid infections spread by sexual contact. . Wear cotton underwear. Also wear pantyhose with a cotton crotch. If you feel comfortable without it, skip wearing underwear to bed. Yeast thrives in [employer] Your symptoms should improve in the next day or two.  GET HELP RIGHT AWAY IF:  . You have pain in your lower abdomen ( pelvic area or over your ovaries) . You develop nausea or vomiting . You develop a fever . Your discharge changes or worsens . You have persistent pain with intercourse . You develop shortness of breath, a rapid pulse, or you faint.  These symptoms could be signs of problems or infections that need to be evaluated by a medical provider now.  MAKE SURE YOU    Understand these instructions.  Will watch your condition.  Will get help right away if you are not doing well or get worse.  Your e-visit answers were reviewed by a board certified advanced clinical practitioner to complete your personal care plan. Depending upon the condition, your plan could have included both over the counter or prescription medications. Please review your pharmacy choice  to make sure that you have choses a pharmacy that is open for you to pick up any needed prescription, Your safety is important to [place]. If you have drug allergies check your prescription carefully.   You can use MyChart to ask questions about today's visit, request a non-urgent call back, or ask for a work or school excuse for 24 hours related to this e-Visit. If it has been greater than 24 hours you will need to follow up with your provider, or enter a new e-Visit to address those concerns. You will get a MyChart message within the next two days asking about your experience. I hope that your e-visit has been valuable and will speed your recovery.  Prudy Feeler PA-C  Approximately 5 minutes was spent documenting and reviewing patient's chart.

## 2019-08-05 ENCOUNTER — Telehealth: Payer: Medicaid Other | Admitting: Nurse Practitioner

## 2019-08-05 DIAGNOSIS — B9789 Other viral agents as the cause of diseases classified elsewhere: Secondary | ICD-10-CM | POA: Diagnosis not present

## 2019-08-05 DIAGNOSIS — J329 Chronic sinusitis, unspecified: Secondary | ICD-10-CM | POA: Diagnosis not present

## 2019-08-05 MED ORDER — FLUTICASONE PROPIONATE 50 MCG/ACT NA SUSP: 2.0000 | Freq: Every day | NASAL | 6 refills | Status: AC

## 2019-08-05 NOTE — Progress Notes (Signed)

## 2019-08-29 ENCOUNTER — Telehealth: Payer: Medicaid Other | Admitting: Physician Assistant

## 2019-08-29 ENCOUNTER — Inpatient Hospital Stay
Admission: RE | Admit: 2019-08-29 | Discharge: 2019-08-29 | Disposition: A | Discharge status: Home / Self Care | Payer: Medicaid Other | Source: Ambulatory Visit

## 2019-08-29 DIAGNOSIS — R399 Unspecified symptoms and signs involving the genitourinary system: Secondary | ICD-10-CM

## 2019-08-29 NOTE — Progress Notes (Signed)
Hi Kayla Howell,  I am sorry you are not feeling well. I see you have had this problem several times in the past few months. I would feel more comfortable if you were seen face to face and provided a urine sample. It is very important to get a urine culture for this problem to see what (if any) bacteria grow.  Please contact your PCP for this problem -- if you cannot be seen there, see below for Urgent Care locations.   Based on what you shared with me, I feel your condition warrants further evaluation and I recommend that you be seen for a face to face office visit.   NOTE: If you entered your credit card information for this eVisit, you will not be charged. You may see a "hold" on your card for the $35 but that hold will drop off and you will not have a charge processed.   If you are having a true medical emergency please call 911.      For an urgent face to face visit, [place] has five urgent care centers for your convenience:      NEW:  [facility] at [facility] Directions [phone] [address] [place], [place] [address] . 10 am - 6pm Monday - Friday    [facility]) Get Driving Directions [phone] [address] [place], [place] [address] . 10 am to 8 pm Monday-Friday . 12 pm to 8 pm [facility] Urgent Care at [facility] Get Driving Directions [phone] [address] [place], [place] [address] . 8 am to 8 pm Monday-Friday . 9 am to 6 pm Saturday . 11 am to 6 pm Sunday     [facility] at [facility] Get Driving Directions  [phone] [address] [place], [place] [address] . 8 am to 8 pm Monday-Friday . 8 am to 4 pm [facility] Urgent Care at [facility] Directions [phone] [address] [place], [place] [address] . 12 pm to 6 pm Monday-Friday      Your e-visit  answers were reviewed by a board certified advanced clinical practitioner to complete your personal care plan.  Thank you for using e-Visits.

## 2019-09-11 ENCOUNTER — Emergency Department
Admission: EM | Admit: 2019-09-11 | Discharge: 2019-09-11 | Disposition: A | Discharge status: Home / Self Care | Payer: Medicaid Other | Attending: Student in an Organized Health Care Education/Training Program | Admitting: Student in an Organized Health Care Education/Training Program

## 2019-09-11 DIAGNOSIS — N898 Other specified noninflammatory disorders of vagina: Secondary | ICD-10-CM | POA: Insufficient documentation

## 2019-09-11 DIAGNOSIS — Z5321 Procedure and treatment not carried out due to patient leaving prior to being seen by health care provider: Secondary | ICD-10-CM | POA: Insufficient documentation

## 2019-09-11 DIAGNOSIS — R319 Hematuria, unspecified: Secondary | ICD-10-CM | POA: Diagnosis not present

## 2019-09-11 DIAGNOSIS — R109 Unspecified abdominal pain: Secondary | ICD-10-CM | POA: Insufficient documentation

## 2019-09-11 LAB — URINALYSIS, COMPLETE (UACMP) WITH MICROSCOPIC
Bilirubin Urine: NEGATIVE
Glucose, UA: NEGATIVE mg/dL
Hgb urine dipstick: NEGATIVE
Ketones, ur: NEGATIVE mg/dL
Leukocytes,Ua: NEGATIVE
Nitrite: NEGATIVE
Protein, ur: NEGATIVE mg/dL
Specific Gravity, Urine: 1.01 (ref 1.005–1.030)
pH: 6 (ref 5.0–8.0)

## 2019-09-11 NOTE — ED Notes (Signed)
Pt reports increased pain with ambulation, minimal pain with palpation

## 2019-09-11 NOTE — ED Notes (Addendum)
Pt reports lower abdominal pain bilaterally for approx two days, to a lesser degree since IUD placement. Pt reports worsening with urination, burning urination. Frequent urination   Pt reports pink tinged urine  Pt reports frequent UTI's and yeast infections since IUD placement.   Pt reports she is scheduled for IUD removal and hysterectomy in June  Pt reports discharge began yesterday, scant amount noticed when wiping, mildly odorous.   Pt denies n/v/d

## 2019-09-11 NOTE — ED Notes (Signed)
Pt continues to not be visualized in room at this time. EDP aware at this time.

## 2019-09-11 NOTE — ED Notes (Signed)
Pt reports first day of LMP 5/22. Pt reports period has been over for approx 3 days, denies residual spotting

## 2019-09-11 NOTE — ED Notes (Signed)
Pt updated on provider delay

## 2019-09-11 NOTE — ED Notes (Signed)
Upon this RN arrival to bedside, pt not visualized in room. Will wait and see if patient returns.

## 2019-09-11 NOTE — ED Triage Notes (Signed)
To ED for lower abdominal pain described as cramping. Dysuria with pink tinge on toilet paper when she wipes. Also describes a cottage cheese-like discharge. IUD was placed four to five months ago in an attempt to get menstrual periods regulated.

## 2019-09-27 ENCOUNTER — Telehealth: Payer: Medicaid Other | Admitting: Physician Assistant

## 2019-09-27 DIAGNOSIS — L309 Dermatitis, unspecified: Secondary | ICD-10-CM | POA: Diagnosis not present

## 2019-09-27 MED ORDER — HYDROCORTISONE 1 % EX LOTN: 1.0000 "application " | TOPICAL_LOTION | Freq: Two times a day (BID) | CUTANEOUS | 0 refills | Status: DC

## 2019-09-27 NOTE — Progress Notes (Signed)
E Visit for Rash  We are sorry that you are not feeling well. Here is how we plan to help!  Based on what you shared with me it looks like you have contact dermatitis.  Contact dermatitis is a skin rash caused by something that touches the skin and causes irritation or inflammation.  Your skin may be red, swollen, dry, cracked, and itch.  The rash should go away in a few days but can last a few weeks.  If you get a rash, it's important to figure out what caused it so the irritant can be avoided in the future.   I have prescribed a topical steroid cream   HOME CARE:   Take cool showers and avoid direct sunlight.  Apply cool compress or wet dressings.  Take a bath in an oatmeal bath.  Sprinkle content of one Aveeno packet under running faucet with comfortably warm water.  Bathe for 15-20 minutes, 1-2 times daily.  Pat dry with a towel. Do not rub the rash.  Use hydrocortisone cream.  Take an antihistamine like Benadryl for widespread rashes that itch.  The adult dose of Benadryl is 25-50 mg by mouth 4 times daily.  Caution:  This type of medication may cause sleepiness.  Do not drink alcohol, drive, or operate dangerous machinery while taking antihistamines.  Do not take these medications if you have prostate enlargement.  Read package instructions thoroughly on all medications that you take.  GET HELP RIGHT AWAY IF:   Symptoms don't go away after treatment.  Severe itching that persists.  If you rash spreads or swells.  If you rash begins to smell.  If it blisters and opens or develops a yellow-brown crust.  You develop a fever.  You have a sore throat.  You become short of breath.  MAKE SURE YOU:  Understand these instructions. Will watch your condition. Will get help right away if you are not doing well or get worse.  Thank you for choosing an e-visit. Your e-visit answers were reviewed by a board certified advanced clinical practitioner to complete your personal  care plan. Depending upon the condition, your plan could have included both over the counter or prescription medications. Please review your pharmacy choice. Be sure that the pharmacy you have chosen is open so that you can pick up your prescription now.  If there is a problem you may message your provider in MyChart to have the prescription routed to another pharmacy. Your safety is important to [place]. If you have drug allergies check your prescription carefully.  For the next 24 hours, you can use MyChart to ask questions about todays visit, request a non-urgent call back, or ask for a work or school excuse from your e-visit provider. You will get an email in the next two days asking about your experience. I hope that your e-visit has been valuable and will speed your recovery.  5 minutes spent on chart

## 2020-01-06 ENCOUNTER — Telehealth: Payer: Medicaid Other | Admitting: Nurse Practitioner

## 2020-01-06 DIAGNOSIS — K219 Gastro-esophageal reflux disease without esophagitis: Secondary | ICD-10-CM

## 2020-01-06 NOTE — Progress Notes (Signed)
Based on what you shared with me it looks like you have severe gerd with abdominal pain, back pain and inability to eat,that should be evaluated in a face to face office visit. This could be issues with gallbladder as well. You will need to contact your PCP on  Monday morning.    NOTE: If you entered your credit card information for this eVisit, you will not be charged. You may see a "hold" on your card for the $35 but that hold will drop off and you will not have a charge processed.  If you are having a true medical emergency please call 911.     For an urgent face to face visit, [place] has four urgent care centers for your convenience:   . [facility]    [phone]                  Get Driving Directions  [address] [place], [place] [address] . 10 am to 8 pm Monday-Friday . 12 pm to 8 pm Saturday-Sunday   . [facility] Care at [facility]  [phone]                  Get Driving Directions  [address] [place], [place] [address] . 8 am to 8 pm Monday-Friday . 9 am to 6 pm Saturday . 11 am to 6 pm Sunday   . [facility] at [facility]  [phone]                  Get Driving Directions   [address] [place], [place] [address] . 8 am to 8 pm Monday-Friday . 8 am to 4 pm Saturday-Sunday    . [facility] Urgent Care at [facility] Directions  [phone]  [address] [place], [place] [address]  . Monday-Friday, 12 PM to 6 PM    Your e-visit answers were reviewed by a board certified advanced clinical practitioner to complete your personal care plan.  Thank you for using e-Visits.

## 2020-01-12 ENCOUNTER — Telehealth: Payer: Medicaid Other | Admitting: Family

## 2020-01-12 DIAGNOSIS — K2960 Other gastritis without bleeding: Secondary | ICD-10-CM

## 2020-01-12 NOTE — Progress Notes (Signed)
Based on what you shared with me, I feel your condition warrants further evaluation and I recommend that you be seen for a face to face office visit.   NOTE: If you entered your credit card information for this eVisit, you will not be charged. You may see a "hold" on your card for the $35 but that hold will drop off and you will not have a charge processed.   If you are having a true medical emergency please call 911.      For an urgent face to face visit, [place] has five urgent care centers for your convenience:      NEW:  [facility] at [place] Get Driving Directions [phone] [address] [place], [place] [address] . 10 am - 6pm Monday - Friday    [facility]) Get Driving Directions [phone] [address] [place], [place] [address] . 10 am to 8 pm Monday-Friday . 12 pm to 8 pm Saturday-Sunday     [place] [facility] at [facility] Get Driving Directions [phone] [address] [place], [place] [address] . 8 am to 8 pm Monday-Friday . 9 am to 6 pm Saturday . 11 am to 6 pm Sunday     [facility] at [facility] [place] Get Driving Directions  [phone] [address] [place], [place] [address] . 8 am to 8 pm Monday-Friday . 8 am to 4 pm Saturday-Sunday   [facility] at [place] Get Driving Directions [phone] [address] [place], [place] [address] . 12 pm to 6 pm Monday-Friday      Your e-visit answers were reviewed by a board certified advanced clinical practitioner to complete your personal care plan.  Thank you for using e-Visits.     

## 2020-01-28 DIAGNOSIS — K219 Gastro-esophageal reflux disease without esophagitis: Secondary | ICD-10-CM | POA: Insufficient documentation

## 2020-01-28 DIAGNOSIS — M9908 Segmental and somatic dysfunction of rib cage: Secondary | ICD-10-CM | POA: Insufficient documentation

## 2020-02-21 ENCOUNTER — Emergency Department
Admission: EM | Admit: 2020-02-21 | Discharge: 2020-02-21 | Disposition: A | Discharge status: Home / Self Care | Payer: Medicaid Other | Attending: Emergency Medicine | Admitting: Emergency Medicine

## 2020-02-21 ENCOUNTER — Other Ambulatory Visit: Payer: Self-pay

## 2020-02-21 ENCOUNTER — Emergency Department: Payer: Medicaid Other

## 2020-02-21 ENCOUNTER — Encounter: Payer: Self-pay | Admitting: Emergency Medicine

## 2020-02-21 DIAGNOSIS — F17200 Nicotine dependence, unspecified, uncomplicated: Secondary | ICD-10-CM | POA: Diagnosis not present

## 2020-02-21 DIAGNOSIS — F411 Generalized anxiety disorder: Secondary | ICD-10-CM | POA: Diagnosis not present

## 2020-02-21 DIAGNOSIS — F410 Panic disorder [episodic paroxysmal anxiety] without agoraphobia: Secondary | ICD-10-CM

## 2020-02-21 HISTORY — DX: Anxiety disorder, unspecified: F41.9

## 2020-02-21 HISTORY — DX: Gastro-esophageal reflux disease without esophagitis: K21.9

## 2020-02-21 LAB — CBC
HCT: 39.3 % (ref 36.0–46.0)
Hemoglobin: 12.8 g/dL (ref 12.0–15.0)
MCH: 25 pg — ABNORMAL LOW (ref 26.0–34.0)
MCHC: 32.6 g/dL (ref 30.0–36.0)
MCV: 76.9 fL — ABNORMAL LOW (ref 80.0–100.0)
Platelets: 242 10*3/uL (ref 150–400)
RBC: 5.11 MIL/uL (ref 3.87–5.11)
RDW: 14.5 % (ref 11.5–15.5)
WBC: 8 10*3/uL (ref 4.0–10.5)
nRBC: 0 % (ref 0.0–0.2)

## 2020-02-21 LAB — COMPREHENSIVE METABOLIC PANEL
ALT: 13 U/L (ref 0–44)
AST: 17 U/L (ref 15–41)
Albumin: 4.4 g/dL (ref 3.5–5.0)
Alkaline Phosphatase: 89 U/L (ref 38–126)
Anion gap: 11 (ref 5–15)
BUN: 8 mg/dL (ref 6–20)
CO2: 19 mmol/L — ABNORMAL LOW (ref 22–32)
Calcium: 9 mg/dL (ref 8.9–10.3)
Chloride: 104 mmol/L (ref 98–111)
Creatinine, Ser: 0.47 mg/dL (ref 0.44–1.00)
GFR, Estimated: >60 mL/min (ref >60)
Glucose, Bld: 104 mg/dL — ABNORMAL HIGH (ref 70–99)
Potassium: 3.2 mmol/L — ABNORMAL LOW (ref 3.5–5.1)
Sodium: 134 mmol/L — ABNORMAL LOW (ref 135–145)
Total Bilirubin: 0.5 mg/dL (ref 0.3–1.2)
Total Protein: 7.8 g/dL (ref 6.5–8.1)

## 2020-02-21 LAB — TROPONIN I (HIGH SENSITIVITY): Troponin I (High Sensitivity): <2 ng/L (ref <18)

## 2020-02-21 MED ORDER — POTASSIUM CHLORIDE CRYS ER 20 MEQ PO TBCR
40.0000 meq | EXTENDED_RELEASE_TABLET | Freq: Once | ORAL | Status: AC
  Administered 2020-02-21: 40 meq via ORAL
  Filled 2020-02-21: qty 2

## 2020-02-21 MED ORDER — LORAZEPAM 1 MG PO TABS: 1.0000 mg | ORAL_TABLET | Freq: Three times a day (TID) | ORAL | 0 refills | Status: AC | PRN

## 2020-02-21 NOTE — Discharge Instructions (Addendum)
Please follow-up closely with your primary care team at [facility], I would recommend referral to [facility] psychiatry and counseling services as well.  No working in dangerous environments or driving within 8 hours of use of lorazepam.  Use only as needed and as prescribed as it can be sedating.

## 2020-02-21 NOTE — ED Triage Notes (Addendum)
Pt presents to ED via POV with c/o dizziness and numbness to extremities. Per EMS pt c/o dizziness and numbness started after she was sitting in the bathtub and had a panic attack.   140/90 90HR 100% RA   Upon arrival to ED pt c/o increasing frequency in panic attacks and length of panic attacks. Pt states feels like dizziness and light-headedness are intermittent. Pt also c/o substernal CP that radiates to bilateral shoulders and arms. Pt states feels light-headed then feels warm then the dizziness and tingling/numbness start after a panic attack starts. Pt A&O x4 upon arrival to ED. Pt states is currently on Zoloft and is increasing dose as ordered by provider.   Facial symmetry intact, grip strength noted to be weak but equal bilaterally, denies numbness when touched to arms/face/legs, pt with no drift noted on assessment by this RN.

## 2020-02-21 NOTE — ED Notes (Signed)
Pt's care discussed with EDP Paduchowski, per EDP no stroke workup at this time.

## 2020-02-21 NOTE — ED Provider Notes (Signed)
[facility] Emergency Department Provider Note   ____________________________________________   None    (approximate)  I have reviewed the triage vital signs and the nursing notes.   HISTORY  Chief Complaint Dizziness, Chest Pain, and Panic Attack    HPI Kayla Howell is a 39 y.o. female here for evaluation of what she describes as possible panic attack  Patient reports for the last several months and throughout much of her life she suffered from anxiety.  She has been feeling very anxious over the last several weeks, she started taking Zoloft and has been increase her dose slowly speaking to her doctor as recent as yesterday regarding her anxiety.  Throughout the last several days she has been experiencing a feeling of anxiousness, and today she was taking a bath when she started to feel anxious and overwhelming feeling she then started feeling numb and tingly in her mouth and both hands and her feet.  This was then followed by feeling of lightheadedness as though she was going to pass out  She reports she is very anxious.  She has a history of anxiety and in the past had suffered with severe anxiety due to acid reflux  No history of heart disease.  Does report that when the symptoms occur she has a feeling of chest tightness that is been present for at least 3 days and occasionally she will get a slight sharp shooting pain in the area of her breastbone  Does not take any estrogens.  No recent surgeries.  Previous distant hysterectomy  No history of blood clots.  No recent trips or travel.  No leg swelling.   Denies trouble breathing except during the episode where she felt like she was panicking  Family history of heart disease only in her grandfather  Past Medical History:  Diagnosis Date  . Anxiety   . GERD (gastroesophageal reflux disease)   . Medical history non-contributory     Patient Active Problem List   Diagnosis Date Noted  . Pregnancy  09/16/2015    Past Surgical History:  Procedure Laterality Date  . ABDOMINAL HYSTERECTOMY    . NO PAST SURGERIES      Prior to Admission medications   Medication Sig Start Date End Date Taking? Authorizing Provider  cephALEXin (KEFLEX) 500 MG capsule Take 1 capsule (500 mg total) by mouth 2 (two) times daily. 06/05/19   Remus Loffler, PA-C  famotidine (PEPCID) 20 MG tablet Take 1 tablet (20 mg total) by mouth 2 (two) times daily. 06/03/19   Irean Hong, MD  fluticasone (FLONASE) 50 MCG/ACT nasal spray Place 2 sprays into both nostrils daily. 08/05/19   Daphine Deutscher, Mary-Margaret, FNP  hydrocortisone 1 % lotion Apply 1 application topically 2 (two) times daily. 09/27/19   Arlyn Dunning, PA-C  LORazepam (ATIVAN) 1 MG tablet Take 1 tablet (1 mg total) by mouth every 8 (eight) hours as needed for up to 5 days for anxiety. 02/21/20 02/26/20  Sharyn Creamer, MD  metroNIDAZOLE (METROGEL VAGINAL) 0.75 % vaginal gel Place 1 Applicatorful vaginally 2 (two) times daily. 06/05/19   Remus Loffler, PA-C    Allergies Patient has no known allergies.  Family History  Problem Relation Age of Onset  . Healthy Mother   . Healthy Father     Social History Social History   Tobacco Use  . Smoking status: Current Every Day Smoker    Packs/day: 1.00  . Smokeless tobacco: Never Used  Vaping Use  .  Vaping Use: Never used  Substance Use Topics  . Alcohol use: No  . Drug use: No    Review of Systems Constitutional: No fever/chills Eyes: No visual changes. ENT: No sore throat. Cardiovascular: Denies chest pain except occasional sharp pains that seem as though they radiate in different directions from her breastbone for the last few days off and on. Respiratory: Denies shortness of breath except during her episode earlier today which is now resolved. Gastrointestinal: No abdominal pain.   Genitourinary: Negative for dysuria. Musculoskeletal: Negative for back pain. Skin: Negative for rash. Neurological:  Negative for headaches, areas of focal weakness or numbness except she did feel tingling around her mouth and both feet and both legs during the episode earlier today.  Denies any thoughts about hurting herself or anyone else.  No suicidal thoughts.  ____________________________________________   PHYSICAL EXAM:  VITAL SIGNS: ED Triage Vitals  Enc Vitals Group     BP 02/21/20 1303 133/75     Pulse Rate 02/21/20 1303 84     Resp 02/21/20 1303 (!) 24     Temp 02/21/20 1303 98.3 F (36.8 C)     Temp Source 02/21/20 1303 Oral     SpO2 02/21/20 1303 99 %     Weight 02/21/20 1259 229 lb (103.9 kg)     Height 02/21/20 1259 5\' 10"  (1.778 m)     Head Circumference --      Peak Flow --      Pain Score 02/21/20 1258 7     Pain Loc --      Pain Edu? --      Excl. in GC? --     Constitutional: Alert and oriented. Well appearing and in no acute distress.  She is very pleasant.  Her family at bedside. Eyes: Conjunctivae are normal. Head: Atraumatic. Nose: No congestion/rhinnorhea. Mouth/Throat: Mucous membranes are moist. Neck: No stridor.  Cardiovascular: Normal rate, regular rhythm. Grossly normal heart sounds.  Good peripheral circulation. Respiratory: Normal respiratory effort.  No retractions. Lungs CTAB. Gastrointestinal: Soft and nontender. No distention. Musculoskeletal: No lower extremity tenderness nor edema. Neurologic:  Normal speech and language. No gross focal neurologic deficits are appreciated.  Skin:  Skin is warm, dry and intact. No rash noted. Psychiatric: Mood and affect are normal. Speech and behavior are normal.  ____________________________________________   LABS (all labs ordered are listed, but only abnormal results are displayed)  Labs Reviewed  CBC - Abnormal; Notable for the following components:      Result Value   MCV 76.9 (*)    MCH 25.0 (*)    All other components within normal limits  COMPREHENSIVE METABOLIC PANEL - Abnormal; Notable for the  following components:   Sodium 134 (*)    Potassium 3.2 (*)    CO2 19 (*)    Glucose, Bld 104 (*)    All other components within normal limits  TROPONIN I (HIGH SENSITIVITY)   ____________________________________________  EKG  Reviewed interpreted by me at 1300 Heart rate 99 QRS 99 QTc 470 Voltages, mild very nonspecific T wave abnormality, question repolarization abnormality.  No STEMI.  Overall appears similar to EKG from October 2005 ____________________________________________  RADIOLOGY  DG Chest 2 View  Result Date: 02/21/2020 CLINICAL DATA:  Increasing frequency and length of panic attacks EXAM: CHEST - 2 VIEW COMPARISON:  06/03/2019 FINDINGS: Normal heart size, mediastinal contours, and pulmonary vascularity. Lungs clear. No infiltrate, pleural effusion, or pneumothorax. Osseous structures unremarkable. IMPRESSION: No acute abnormalities. Electronically Signed  By: Ulyses Southward M.D.   On: 02/21/2020 13:25    Chest x-ray reviewed negative for acute ____________________________________________   PROCEDURES  Procedure(s) performed: None  Procedures  Critical Care performed: No  ____________________________________________   INITIAL IMPRESSION / ASSESSMENT AND PLAN / ED COURSE  Pertinent labs & imaging results that were available during my care of the patient were reviewed by me and considered in my medical decision making (see chart for details).   Patient without noted risk factors for coronary disease.  She has atypical chest pain in the setting of anxiety state, being treated with her primary care doctor.  She does endorse that symptoms do not seem to be getting better but almost seem to be worsening despite being on Zoloft for about 3 weeks now at 75 mg daily today  Patient symptoms seem most consistent with generalized anxiety and possible panic attack.  Her work-up here including ECG and troponin as well as her clinical history very reassuring with very  atypical symptoms.  No noted risk factors for pulmonary embolism, no tachycardia, no hypoxia, denies pleuritic chest pain rather seems to have episodes of sharp very brief lightening-like pain under the breastbone that comes and goes.  Discussed with her, and recommended that she continue to follow the advice of her primary and contact them again today and also recommend psychiatry or counseling referral which she reports she was just going to talk to them about as well and had to talk to her doctor about yesterday.  I think this is an appropriate course.  At present I do not see evidence of acute life threat and suspect likely anxiety driving symptoms  Return precautions and treatment recommendations and follow-up discussed with the patient who is agreeable with the plan.  Counseled patient as well as family who are at the bedside about safety and safe use and storage of lorazepam including not driving or performing dangerous activities while utilizing.  Patient in agreement.      ____________________________________________   FINAL CLINICAL IMPRESSION(S) / ED DIAGNOSES  Final diagnoses:  GAD (generalized anxiety disorder)  Panic attack        Note:  This document was prepared using Dragon voice recognition software and may include unintentional dictation errors       Sharyn Creamer, MD 02/21/20 1428

## 2020-02-23 DIAGNOSIS — F419 Anxiety disorder, unspecified: Secondary | ICD-10-CM | POA: Insufficient documentation

## 2020-03-10 ENCOUNTER — Other Ambulatory Visit: Payer: Self-pay

## 2020-03-10 ENCOUNTER — Ambulatory Visit
Admission: EM | Admit: 2020-03-10 | Discharge: 2020-03-10 | Disposition: A | Discharge status: Home / Self Care | Payer: Medicaid Other | Attending: Emergency Medicine | Admitting: Emergency Medicine

## 2020-03-10 ENCOUNTER — Encounter: Payer: Self-pay | Admitting: Emergency Medicine

## 2020-03-10 DIAGNOSIS — N10 Acute tubulo-interstitial nephritis: Secondary | ICD-10-CM | POA: Insufficient documentation

## 2020-03-10 DIAGNOSIS — K219 Gastro-esophageal reflux disease without esophagitis: Secondary | ICD-10-CM | POA: Diagnosis not present

## 2020-03-10 DIAGNOSIS — F411 Generalized anxiety disorder: Secondary | ICD-10-CM | POA: Insufficient documentation

## 2020-03-10 DIAGNOSIS — N200 Calculus of kidney: Secondary | ICD-10-CM | POA: Insufficient documentation

## 2020-03-10 LAB — URINALYSIS, COMPLETE (UACMP) WITH MICROSCOPIC
Glucose, UA: NEGATIVE mg/dL
Hgb urine dipstick: NEGATIVE
Ketones, ur: 15 mg/dL — AB
Leukocytes,Ua: NEGATIVE
Nitrite: NEGATIVE
Protein, ur: 30 mg/dL — AB
Specific Gravity, Urine: 1.025 (ref 1.005–1.030)
pH: 7 (ref 5.0–8.0)

## 2020-03-10 MED ORDER — CIPRO 500 MG/5ML (10%) PO SUSR: 500.0000 mg | Freq: Two times a day (BID) | ORAL | 0 refills | Status: DC

## 2020-03-10 MED ORDER — TAMSULOSIN HCL 0.4 MG PO CAPS
0.4000 mg | ORAL_CAPSULE | Freq: Every day | ORAL | 0 refills | Status: DC
Start: 2020-03-10 — End: 2020-10-01

## 2020-03-10 NOTE — ED Provider Notes (Addendum)
[facility] [place] [facility] [place]   Name: Kayla Howell DOB: 04-May-1980 MRN: [id number] CSN: [id number] PCP: Pcp, No  Arrival date and time:  03/10/20 1226  Chief Complaint:  Back Pain, Chest Pain, and Dysuria   NOTE: Prior to seeing the patient today, I have reviewed the triage nursing documentation and vital signs. Clinical staff has updated patient's PMH/PSHx, current medication list, and drug allergies/intolerances to ensure comprehensive history available to assist in medical decision making.   History:   HPI: Kayla Howell is a 39 y.o. female who presents today with complaints of abdominal pain and burning with urination.  She is also noticing urinary frequency has gotten worse over the past week.  She denies any fever with the symptoms.  She has also noticed some mid/lower back pain that is outside of her normal back discomfort.  She has no recent diagnosis of urinary tract infection; no known diagnosis of kidney stones.  Patient is also reporting chest pain/pressure which started approximately 2 days ago.  Patient has a known history of reflux and anxiety and she believes that this chest pain may be associated to some increased anxiety that she has been experiencing recently.  She states his chest pressure radiates to her back.  She denies any nausea, vomiting, palpitations, shortness of breath, or peripheral edema.   Past Medical History:  Diagnosis Date  . Anxiety   . GERD (gastroesophageal reflux disease)   . Medical history non-contributory     Past Surgical History:  Procedure Laterality Date  . ABDOMINAL HYSTERECTOMY    . NO PAST SURGERIES      Family History  Problem Relation Age of Onset  . Healthy Mother   . Healthy Father     Social History   Tobacco Use  . Smoking status: Current Every Day Smoker    Packs/day: 1.00  . Smokeless tobacco: Never Used  Vaping Use  . Vaping Use: Never used  Substance Use Topics  . Alcohol use: No  . Drug use:  No    Patient Active Problem List   Diagnosis Date Noted  . Pregnancy 09/16/2015    Home Medications:    Current Meds  Medication Sig  . citalopram (CELEXA) 10 MG tablet Take by mouth.  . famotidine (PEPCID) 20 MG tablet Take 1 tablet (20 mg total) by mouth 2 (two) times daily.  . fluticasone (FLONASE) 50 MCG/ACT nasal spray Place 2 sprays into both nostrils daily.  . pantoprazole (PROTONIX) 40 MG tablet Take by mouth.    Allergies:   Patient has no known allergies.  Review of Systems (ROS): Review of Systems  Constitutional: Positive for appetite change. Negative for fatigue and fever.  HENT: Negative.   Respiratory: Negative for cough, chest tightness and shortness of breath.   Cardiovascular: Positive for chest pain.  Gastrointestinal: Positive for abdominal pain. Negative for constipation, diarrhea, nausea and vomiting.  Genitourinary: Positive for dysuria, flank pain and frequency. Negative for hematuria.  Musculoskeletal: Positive for arthralgias and myalgias.  Neurological: Negative for weakness and headaches.     Vital Signs: Today's Vitals   03/10/20 1300 03/10/20 1304 03/10/20 1354  BP:  (!) 133/91   Pulse:  77   Resp:  14   Temp:  98.4 F (36.9 C)   TempSrc:  Oral   SpO2:  100%   Weight: 225 lb (102.1 kg)    Height: 5\' 10"  (1.778 m)    PainSc: 7   7  Physical Exam: Physical Exam Vitals and nursing note reviewed.  Constitutional:      Appearance: Normal appearance.  Cardiovascular:     Rate and Rhythm: Normal rate and regular rhythm.     Pulses: Normal pulses.          Radial pulses are 2+ on the right side and 2+ on the left side.     Heart sounds: Normal heart sounds.  Pulmonary:     Effort: Pulmonary effort is normal.     Breath sounds: Normal breath sounds.  Abdominal:     General: Abdomen is flat. Bowel sounds are normal.     Palpations: Abdomen is soft.     Tenderness: There is abdominal tenderness in the epigastric area and  suprapubic area. There is left CVA tenderness.  Skin:    General: Skin is warm and dry.     Capillary Refill: Capillary refill takes less than 2 seconds.  Neurological:     General: No focal deficit present.     Mental Status: She is alert and oriented to person, place, and time.      Urgent Care Treatments / Results:   LABS: PLEASE NOTE: all labs that were ordered this encounter are listed, however only abnormal results are displayed. Labs Reviewed  URINALYSIS, COMPLETE (UACMP) WITH MICROSCOPIC - Abnormal; Notable for the following components:      Result Value   Color, Urine AMBER (*)    Bilirubin Urine SMALL (*)    Ketones, ur 15 (*)    Protein, ur 30 (*)    Bacteria, UA MANY (*)    All other components within normal limits  URINE CULTURE    EKG: Normal sinus rhythm with a ventricular rate of 78 bpm.  Overall normal EKG.  RADIOLOGY: No results found.  PROCEDURES: Procedures  MEDICATIONS RECEIVED THIS VISIT: Medications - No data to display  PERTINENT CLINICAL COURSE NOTES/UPDATES:  Initial Impression / Assessment and Plan / Urgent Care Course:  Pertinent labs & imaging results that were available during my care of the patient were personally reviewed by me and considered in my medical decision making (see lab/imaging section of note for values and interpretations).  Kayla Howell is a 39 y.o. female who presents to [facility] today with complaints of chest pressure, abdominal pain, and dysuria, diagnosed with kidney stone versus pyelonephritis, GERD and anxiety, and treated as such with the medications below. NP and patient reviewed discharge instructions below during visit.   Patient is well appearing overall in clinic today. She does not appear to be in any acute distress. Presenting symptoms (see HPI) and exam as documented above.   I have reviewed the follow up and strict return precautions for any new or worsening symptoms. Patient is aware of symptoms  that would be deemed urgent/emergent, and would thus require further evaluation either here or in the emergency department. At the time of discharge, she verbalized understanding and consent with the discharge plan as it was reviewed with her. All questions were fielded by provider and/or clinic staff prior to patient discharge.    Final Clinical Impressions / Urgent Care Diagnoses:   Final diagnoses:  Nephrolithiasis  Generalized anxiety disorder  Gastroesophageal reflux disease without esophagitis  Acute pyelonephritis    New Prescriptions:  [place] Controlled Substance Registry consulted? Not Applicable  Meds ordered this encounter  Medications  . ciprofloxacin (CIPRO) 500 MG/5ML (10%) suspension    Sig: Take 5 mLs (500 mg total) by mouth 2 (two)  times daily.    Dispense:  100 mL    Refill:  0  . tamsulosin (FLOMAX) 0.4 MG CAPS capsule    Sig: Take 1 capsule (0.4 mg total) by mouth daily after breakfast.    Dispense:  14 capsule    Refill:  0      Discharge Instructions     You were seen for chest pain, urinary discomfort and back pain and are being treated for kidney infection, possible kidney stone.   Take the antibiotics as prescribed until they're finished. If you think you're having a reaction, stop the medication, take benadryl and go to the nearest urgent care/emergency room. Take a probiotic while taking the antibiotic to decrease the chances of stomach upset.   We are sending your urine out for a culture. If we need to add or change any medications, our nurse will give you a call to let you know.  There is a strong likelihood that your chest pressure is due to your anxiety. Monitor your symptoms and follow-up with your primary.   To help your abdominal pain, try an OTC Mylanta before/after meals.   Take care, Dr. Sharlet Salina, NP-c     Recommended Follow up Care:  Patient encouraged to follow up with the following provider within the specified time frame, or sooner  as dictated by the severity of her symptoms. As always, she was instructed that for any urgent/emergent care needs, she should seek care either here or in the emergency department for more immediate evaluation.   Bailey Mech, DNP, NP-c    Bailey Mech, NP 03/10/20 1720    Bailey Mech, NP 03/10/20 1722

## 2020-03-10 NOTE — Discharge Instructions (Addendum)
You were seen for chest pain, urinary discomfort and back pain and are being treated for kidney infection, possible kidney stone.   Take the antibiotics as prescribed until they're finished. If you think you're having a reaction, stop the medication, take benadryl and go to the nearest urgent care/emergency room. Take a probiotic while taking the antibiotic to decrease the chances of stomach upset.   We are sending your urine out for a culture. If we need to add or change any medications, our nurse will give you a call to let you know.  There is a strong likelihood that your chest pressure is due to your anxiety. Monitor your symptoms and follow-up with your primary.   To help your abdominal pain, try an OTC Mylanta before/after meals.   Take care, Dr. Sharlet Salina, NP-c

## 2020-03-10 NOTE — ED Triage Notes (Signed)
Patient c/o abdominal pain and burning when urinating and urinary frequency that has gotten worse over the past week.  Patient also reports chest pain that radiates to her upper back that started couple of days ago. Patient denies fevers.  Patient denies N/V/D.

## 2020-03-12 LAB — URINE CULTURE

## 2020-03-16 ENCOUNTER — Emergency Department: Payer: Medicaid Other

## 2020-03-16 ENCOUNTER — Other Ambulatory Visit: Payer: Self-pay

## 2020-03-16 ENCOUNTER — Encounter: Payer: Self-pay | Admitting: Emergency Medicine

## 2020-03-16 DIAGNOSIS — J9801 Acute bronchospasm: Secondary | ICD-10-CM | POA: Insufficient documentation

## 2020-03-16 DIAGNOSIS — R0789 Other chest pain: Secondary | ICD-10-CM | POA: Insufficient documentation

## 2020-03-16 DIAGNOSIS — K219 Gastro-esophageal reflux disease without esophagitis: Secondary | ICD-10-CM | POA: Insufficient documentation

## 2020-03-16 DIAGNOSIS — Z79899 Other long term (current) drug therapy: Secondary | ICD-10-CM | POA: Insufficient documentation

## 2020-03-16 DIAGNOSIS — F17200 Nicotine dependence, unspecified, uncomplicated: Secondary | ICD-10-CM | POA: Insufficient documentation

## 2020-03-16 LAB — URINALYSIS, COMPLETE (UACMP) WITH MICROSCOPIC
Bacteria, UA: NONE SEEN
Bilirubin Urine: NEGATIVE
Glucose, UA: NEGATIVE mg/dL
Hgb urine dipstick: NEGATIVE
Ketones, ur: NEGATIVE mg/dL
Leukocytes,Ua: NEGATIVE
Nitrite: NEGATIVE
Protein, ur: NEGATIVE mg/dL
Specific Gravity, Urine: 1.023 (ref 1.005–1.030)
pH: 8 (ref 5.0–8.0)

## 2020-03-16 LAB — CBC
HCT: 38 % (ref 36.0–46.0)
Hemoglobin: 12.1 g/dL (ref 12.0–15.0)
MCH: 25.1 pg — ABNORMAL LOW (ref 26.0–34.0)
MCHC: 31.8 g/dL (ref 30.0–36.0)
MCV: 78.8 fL — ABNORMAL LOW (ref 80.0–100.0)
Platelets: 189 10*3/uL (ref 150–400)
RBC: 4.82 MIL/uL (ref 3.87–5.11)
RDW: 14.5 % (ref 11.5–15.5)
WBC: 7.9 10*3/uL (ref 4.0–10.5)
nRBC: 0 % (ref 0.0–0.2)

## 2020-03-16 LAB — BASIC METABOLIC PANEL
Anion gap: 10 (ref 5–15)
BUN: 11 mg/dL (ref 6–20)
CO2: 21 mmol/L — ABNORMAL LOW (ref 22–32)
Calcium: 9.1 mg/dL (ref 8.9–10.3)
Chloride: 106 mmol/L (ref 98–111)
Creatinine, Ser: 0.57 mg/dL (ref 0.44–1.00)
GFR, Estimated: >60 mL/min (ref >60)
Glucose, Bld: 105 mg/dL — ABNORMAL HIGH (ref 70–99)
Potassium: 3.6 mmol/L (ref 3.5–5.1)
Sodium: 137 mmol/L (ref 135–145)

## 2020-03-16 LAB — TROPONIN I (HIGH SENSITIVITY)
Troponin I (High Sensitivity): <2 ng/L (ref <18)
Troponin I (High Sensitivity): 2 ng/L (ref <18)

## 2020-03-16 NOTE — ED Triage Notes (Addendum)
PT reports CP x 1 week, seen before for similar sxs, pt has GERD and Anxiety which she is taking meds for.PT states she has been having shortness of breath and states CP is painful with inspiration, pt states the pain goes across her chest under armpits to her back.  PT also reports L upper back pain.   Pt states she was told she UTI also recently, but could not tolerate antibiotics, pt was seen at [facility] on 12/1 and was told her urine was clear, but states she continues to have dysuria.

## 2020-03-17 ENCOUNTER — Emergency Department
Admission: EM | Admit: 2020-03-17 | Discharge: 2020-03-17 | Disposition: A | Discharge status: Home / Self Care | Payer: Medicaid Other | Attending: Emergency Medicine | Admitting: Emergency Medicine

## 2020-03-17 DIAGNOSIS — J9801 Acute bronchospasm: Secondary | ICD-10-CM

## 2020-03-17 DIAGNOSIS — K219 Gastro-esophageal reflux disease without esophagitis: Secondary | ICD-10-CM

## 2020-03-17 LAB — HEPATIC FUNCTION PANEL
ALT: 14 U/L (ref 0–44)
AST: 17 U/L (ref 15–41)
Albumin: 4.4 g/dL (ref 3.5–5.0)
Alkaline Phosphatase: 80 U/L (ref 38–126)
Bilirubin, Direct: <0.1 mg/dL (ref 0.0–0.2)
Total Bilirubin: 0.5 mg/dL (ref 0.3–1.2)
Total Protein: 7.6 g/dL (ref 6.5–8.1)

## 2020-03-17 LAB — LIPASE, BLOOD: Lipase: 27 U/L (ref 11–51)

## 2020-03-17 LAB — FIBRIN DERIVATIVES D-DIMER (ARMC ONLY): Fibrin derivatives D-dimer (ARMC): 402.09 ng/mL (FEU) (ref 0.00–499.00)

## 2020-03-17 MED ORDER — ALBUTEROL SULFATE HFA 108 (90 BASE) MCG/ACT IN AERS: 2.0000 | INHALATION_SPRAY | Freq: Four times a day (QID) | RESPIRATORY_TRACT | 2 refills | Status: DC | PRN

## 2020-03-17 MED ORDER — ALUM & MAG HYDROXIDE-SIMETH 400-400-40 MG/5ML PO SUSP: 5.0000 mL | Freq: Four times a day (QID) | ORAL | 0 refills | Status: DC | PRN

## 2020-03-17 MED ORDER — IPRATROPIUM-ALBUTEROL 0.5-2.5 (3) MG/3ML IN SOLN
3.0000 mL | Freq: Once | RESPIRATORY_TRACT | Status: AC
  Administered 2020-03-17: 3 mL via RESPIRATORY_TRACT
  Filled 2020-03-17: qty 3

## 2020-03-17 MED ORDER — ACETAMINOPHEN 500 MG PO TABS
ORAL_TABLET | ORAL | Status: AC
  Filled 2020-03-17: qty 2

## 2020-03-17 MED ORDER — FAMOTIDINE IN NACL 20-0.9 MG/50ML-% IV SOLN
20.0000 mg | Freq: Once | INTRAVENOUS | Status: AC
  Administered 2020-03-17: 20 mg via INTRAVENOUS
  Filled 2020-03-17: qty 50

## 2020-03-17 MED ORDER — ACETAMINOPHEN 500 MG PO TABS
1000.0000 mg | ORAL_TABLET | Freq: Once | ORAL | Status: AC
  Administered 2020-03-17: 1000 mg via ORAL

## 2020-03-17 MED ORDER — ALUM & MAG HYDROXIDE-SIMETH 200-200-20 MG/5ML PO SUSP
30.0000 mL | Freq: Once | ORAL | Status: AC
  Administered 2020-03-17: 30 mL via ORAL
  Filled 2020-03-17: qty 30

## 2020-03-17 NOTE — ED Provider Notes (Signed)
[facility] Emergency Department Provider Note  ____________________________________________  Time seen: Approximately 3:40 AM  I have reviewed the triage vital signs and the nursing notes.   HISTORY  Chief Complaint Chest Pain   HPI Kayla Howell is a 39 y.o. female with a history of anxiety, GERD, smoking who presents for evaluation of chest pain.  Patient reports 1 week of constant chest pain.  She describes the pain as tightness and pulling that starts in the center of her chest radiates to both sides of the chest, under her arms, and to the upper back.  She reports that sometimes the pain is worse with deep inspiration but not all times.  No cough or congestion, no wheezing.  No history of asthma.  She is a smoker.  She also has a history of GERD.  She reports that the pain is worse with eating, also worse when she lays down. Not worse with exertion.  Initially she thought it was due to her GERD or anxiety but since is not improving she came to the emergency room for evaluation.  No personal or family history of PE or DVT, no recent travel immobilization, no leg pain or swelling, no hemoptysis, no exogenous hormones.  No abdominal pain, no nausea or vomiting, no fever or chills, no dysuria or hematuria, no flank pain.  Patient denies any personal or family history of heart problems.  Patient went to [facility] for these complaints 4 days ago and was told she had a UTI.  She was put on Cipro with no changes in her symptoms.  Past Medical History:  Diagnosis Date  . Anxiety   . GERD (gastroesophageal reflux disease)   . Medical history non-contributory     Patient Active Problem List   Diagnosis Date Noted  . Pregnancy 09/16/2015    Past Surgical History:  Procedure Laterality Date  . ABDOMINAL HYSTERECTOMY    . NO PAST SURGERIES      Prior to Admission medications   Medication Sig Start Date End Date Taking? Authorizing Provider  albuterol (VENTOLIN HFA)  108 (90 Base) MCG/ACT inhaler Inhale 2 puffs into the lungs every 6 (six) hours as needed for wheezing or shortness of breath. 03/17/20   Nita Sickle, MD  alum & mag hydroxide-simeth (MAALOX MAX) 400-400-40 MG/5ML suspension Take 5 mLs by mouth every 6 (six) hours as needed for indigestion. 03/17/20   Don Perking, [place], MD  cephALEXin (KEFLEX) 500 MG capsule Take 1 capsule (500 mg total) by mouth 2 (two) times daily. 06/05/19   Remus Loffler, PA-C  ciprofloxacin (CIPRO) 500 MG/5ML (10%) suspension Take 5 mLs (500 mg total) by mouth 2 (two) times daily. 03/10/20   Bailey Mech, NP  citalopram (CELEXA) 10 MG tablet Take by mouth. 02/23/20 02/22/21  [provider]  famotidine (PEPCID) 20 MG tablet Take 1 tablet (20 mg total) by mouth 2 (two) times daily. 06/03/19   Irean Hong, MD  fluticasone (FLONASE) 50 MCG/ACT nasal spray Place 2 sprays into both nostrils daily. 08/05/19   Daphine Deutscher, Mary-Margaret, FNP  hydrocortisone 1 % lotion Apply 1 application topically 2 (two) times daily. 09/27/19   Arlyn Dunning, PA-C  metroNIDAZOLE (METROGEL VAGINAL) 0.75 % vaginal gel Place 1 Applicatorful vaginally 2 (two) times daily. 06/05/19   Remus Loffler, PA-C  pantoprazole (PROTONIX) 40 MG tablet Take by mouth. 12/29/19 01/11/21  [provider]  tamsulosin (FLOMAX) 0.4 MG CAPS capsule Take 1 capsule (0.4 mg total) by mouth  daily after breakfast. 03/10/20   Bailey Mech, NP    Allergies Patient has no known allergies.  Family History  Problem Relation Age of Onset  . Healthy Mother   . Healthy Father     Social History Social History   Tobacco Use  . Smoking status: Current Every Day Smoker    Packs/day: 1.00  . Smokeless tobacco: Never Used  Vaping Use  . Vaping Use: Never used  Substance Use Topics  . Alcohol use: No  . Drug use: No    Review of Systems  Constitutional: Negative for fever. Eyes: Negative for visual changes. ENT: Negative for sore throat. Neck:  No neck pain  Cardiovascular: + chest pain. Respiratory: Negative for shortness of breath. Gastrointestinal: Negative for abdominal pain, vomiting or diarrhea. Genitourinary: Negative for dysuria. Musculoskeletal: Negative for back pain. Skin: Negative for rash. Neurological: Negative for headaches, weakness or numbness. Psych: No SI or HI  ____________________________________________   PHYSICAL EXAM:  VITAL SIGNS: ED Triage Vitals  Enc Vitals Group     BP 03/16/20 1625 (!) 146/85     Pulse Rate 03/16/20 1625 84     Resp 03/16/20 1625 20     Temp 03/16/20 1625 98 F (36.7 C)     Temp Source 03/16/20 1625 Oral     SpO2 03/16/20 1625 98 %     Weight 03/16/20 1626 229 lb (103.9 kg)     Height 03/16/20 1626 5\' 10"  (1.778 m)     Head Circumference --      Peak Flow --      Pain Score 03/16/20 1631 8     Pain Loc --      Pain Edu? --      Excl. in GC? --     Constitutional: Alert and oriented. Well appearing and in no apparent distress. HEENT:      Head: Normocephalic and atraumatic.         Eyes: Conjunctivae are normal. Sclera is non-icteric.       Mouth/Throat: Mucous membranes are moist.       Neck: Supple with no signs of meningismus. Cardiovascular: Regular rate and rhythm. No murmurs, gallops, or rubs. 2+ symmetrical distal pulses are present in all extremities. No JVD. Respiratory: Normal respiratory effort. Lungs are clear to auscultation bilaterally with diminished air movement bilaterally Gastrointestinal: Soft, mild epigastric tenderness, and non distended with positive bowel sounds. No rebound or guarding. Genitourinary: No CVA tenderness. Musculoskeletal: No edema, cyanosis, or erythema of extremities. Neurologic: Normal speech and language. Face is symmetric. Moving all extremities. No gross focal neurologic deficits are appreciated. Skin: Skin is warm, dry and intact. No rash noted. Psychiatric: Mood and affect are normal. Speech and behavior are  normal.  ____________________________________________   LABS (all labs ordered are listed, but only abnormal results are displayed)  Labs Reviewed  BASIC METABOLIC PANEL - Abnormal; Notable for the following components:      Result Value   CO2 21 (*)    Glucose, Bld 105 (*)    All other components within normal limits  CBC - Abnormal; Notable for the following components:   MCV 78.8 (*)    MCH 25.1 (*)    All other components within normal limits  URINALYSIS, COMPLETE (UACMP) WITH MICROSCOPIC - Abnormal; Notable for the following components:   Color, Urine YELLOW (*)    APPearance HAZY (*)    All other components within normal limits  FIBRIN DERIVATIVES D-DIMER ([facility] ONLY)  HEPATIC FUNCTION  PANEL  LIPASE, BLOOD  TROPONIN I (HIGH SENSITIVITY)  TROPONIN I (HIGH SENSITIVITY)   ____________________________________________  EKG  ED ECG REPORT I, Nita Sicklearolina Brecklyn Galvis, the attending physician, personally viewed and interpreted this ECG.  Normal sinus rhythm, rate of 78, normal intervals, normal axis, no ST elevations or depressions.  [id number] which is seen with compared to prior from November 2021 ____________________________________________  RADIOLOGY  I have personally reviewed the images performed during this visit and I agree with the Radiologist's read.   Interpretation by Radiologist:  DG Chest 2 View  Result Date: 03/16/2020 CLINICAL DATA:  Chest pain, shortness of breath EXAM: CHEST - 2 VIEW COMPARISON:  None. FINDINGS: The heart size and mediastinal contours are within normal limits. Both lungs are clear. The visualized skeletal structures are unremarkable. IMPRESSION: No active cardiopulmonary disease. Electronically Signed   By: Elige KoHetal  Patel   On: 03/16/2020 17:51     ____________________________________________   PROCEDURES  Procedure(s) performed:yes .1-3 Lead EKG Interpretation Performed by: Nita SickleVeronese, [place], MD Authorized by: Nita SickleVeronese, [place], MD      Interpretation: non-specific     ECG rate assessment: normal     Rhythm: sinus rhythm     Ectopy: none     Critical Care performed:  None ____________________________________________   INITIAL IMPRESSION / ASSESSMENT AND PLAN / ED COURSE   39 y.o. female with a history of anxiety, GERD, smoking who presents for evaluation of chest tightness radiating to the back, worse with eating and laying down not changed by exertion. Patient is well appearing, in no distress, normal vitals, abdomen is soft with minimal tenderness on the epigastric region, diminished air movement bilaterally with no crackles or wheezing. EKG non ischemic and unchanged from prior.  Ddx Gerd, indigestion, bronchospasms, PUD, gastritis, pleurisy, pericarditis, gb pathology, pancreatitis.  Atypical for ACS especially with a normal EKG and 2 - high-sensitivity troponin.  Patient is PERC negative with a normal D-dimer ruling out PE.  Metabolic panel with no significant abnormality, normal LFTs and lipase.  No leukocytosis, no anemia, no thrombocytosis.  UA with no evidence of blood or urinary tract infection.  Chest x-ray visualized by me with no acute findings, confirmed by radiology.  Possibly GERD causing bronchospasms in a patient with a history of smoking since patient has very diminished breath sounds bilaterally.  Patient was given 2 duo nebs, IV Pepcid and Maalox with significant improvement of her symptoms.  She is now moving good air and does not feel tight anymore.  Review of old medical records show the patient had an endoscopy less than a month ago which was normal. Patient underwent endoscopy for globus sensation.   Recommended close follow-up with GI for further management of possible gerd. Since symptoms improved with duoneb, will send a prescription for albuterol for possible bronchospasms.  Patient is already on pantoprazole 80 mg daily and Pepcid 20 mg daily.        _____________________________________________ Please note:  Patient was evaluated in Emergency Department today for the symptoms described in the history of present illness. Patient was evaluated in the context of the global COVID-19 pandemic, which necessitated consideration that the patient might be at risk for infection with the SARS-CoV-2 virus that causes COVID-19. Institutional protocols and algorithms that pertain to the evaluation of patients at risk for COVID-19 are in a state of rapid change based on information released by regulatory bodies including the CDC and federal and state organizations. These policies and algorithms were followed during the patient's care in the  ED.  Some ED evaluations and interventions may be delayed as a result of limited staffing during the pandemic.   [place] Controlled Substance Database was reviewed by me. ____________________________________________   FINAL CLINICAL IMPRESSION(S) / ED DIAGNOSES   Final diagnoses:  Gastroesophageal reflux disease, unspecified whether esophagitis present  Bronchospasm      NEW MEDICATIONS STARTED DURING THIS VISIT:  ED Discharge Orders         Ordered    albuterol (VENTOLIN HFA) 108 (90 Base) MCG/ACT inhaler  Every 6 hours PRN        03/17/20 0402    alum & mag hydroxide-simeth (MAALOX MAX) 400-400-40 MG/5ML suspension  Every 6 hours PRN,   Status:  Discontinued        03/17/20 0402    alum & mag hydroxide-simeth (MAALOX MAX) 400-400-40 MG/5ML suspension  Every 6 hours PRN        03/17/20 0402           Note:  This document was prepared using Dragon voice recognition software and may include unintentional dictation errors.    Don Perking, [place], MD 03/17/20 [phone]

## 2020-04-09 DIAGNOSIS — R1310 Dysphagia, unspecified: Secondary | ICD-10-CM | POA: Insufficient documentation

## 2020-04-09 DIAGNOSIS — R634 Abnormal weight loss: Secondary | ICD-10-CM | POA: Insufficient documentation

## 2020-05-01 ENCOUNTER — Ambulatory Visit
Admission: EM | Admit: 2020-05-01 | Discharge: 2020-05-01 | Disposition: A | Discharge status: Home / Self Care | Payer: Medicaid Other | Attending: Sports Medicine | Admitting: Sports Medicine

## 2020-05-01 ENCOUNTER — Other Ambulatory Visit: Payer: Self-pay

## 2020-05-01 ENCOUNTER — Encounter: Payer: Self-pay | Admitting: Emergency Medicine

## 2020-05-01 DIAGNOSIS — M791 Myalgia, unspecified site: Secondary | ICD-10-CM | POA: Insufficient documentation

## 2020-05-01 DIAGNOSIS — J069 Acute upper respiratory infection, unspecified: Secondary | ICD-10-CM | POA: Insufficient documentation

## 2020-05-01 DIAGNOSIS — Z20822 Contact with and (suspected) exposure to covid-19: Secondary | ICD-10-CM | POA: Diagnosis not present

## 2020-05-01 DIAGNOSIS — U071 COVID-19: Secondary | ICD-10-CM | POA: Insufficient documentation

## 2020-05-01 DIAGNOSIS — R509 Fever, unspecified: Secondary | ICD-10-CM | POA: Insufficient documentation

## 2020-05-01 NOTE — Discharge Instructions (Addendum)
I recommended going ahead and getting a COVID test.  It would take between 6 and 24 hours for that to return from the hospital.  In the interim she needs to isolate and use a mask at home. She stays at home so no work note as needed. Per CDC guidelines if she is positive she needs to quarantine for 5 days and can come out of quarantine after 5 days if she is asymptomatic. Plenty of rest, plenty of fluids.  Over-the-counter meds as needed.  Tylenol or Motrin for fever discomfort. Educational handouts were provided. Red flag signs and symptoms were discussed in detail and when to seek out immediate medical attention.  Patient voiced verbal understanding. Follow-up here as needed.

## 2020-05-01 NOTE — ED Provider Notes (Signed)
[facility]    CSN: [id number] Arrival date & time: 05/01/20  1718      History   Chief Complaint Chief Complaint  Patient presents with  . Cough    HPI Kayla Howell is a 40 y.o. female.   Pleasant 40 year old female who presents for evaluation of URI symptoms.  Her symptoms began this morning with cough fever rhinorrhea congestion and some chest tightness.  Complicating her situation is she has some baseline anxiety and GERD.  She has had positive COVID exposure.  She is also having associated sore throat and myalgias.  No abdominal pain or urinary symptoms.  She has not been vaccinated against COVID but she has had the flu shot.  She has no history of COVID in the past.  No red flag signs or symptoms elicited on history.     Past Medical History:  Diagnosis Date  . Anxiety   . GERD (gastroesophageal reflux disease)   . Medical history non-contributory     Patient Active Problem List   Diagnosis Date Noted  . Pregnancy 09/16/2015    Past Surgical History:  Procedure Laterality Date  . ABDOMINAL HYSTERECTOMY    . NO PAST SURGERIES      OB History    Gravida  9   Para  7   Term  7   Preterm  0   AB  2   Living  0     SAB  1   IAB  1   Ectopic  0   Multiple  0   Live Births               Home Medications    Prior to Admission medications   Medication Sig Start Date End Date Taking? Authorizing Provider  alum & mag hydroxide-simeth (MAALOX MAX) 400-400-40 MG/5ML suspension Take 5 mLs by mouth every 6 (six) hours as needed for indigestion. 03/17/20  Yes Don Perking, [place], MD  famotidine (PEPCID) 20 MG tablet Take 1 tablet (20 mg total) by mouth 2 (two) times daily. 06/03/19  Yes Irean Hong, MD  pantoprazole (PROTONIX) 40 MG tablet Take by mouth. 12/29/19 01/11/21 Yes [provider]  albuterol (VENTOLIN HFA) 108 (90 Base) MCG/ACT inhaler Inhale 2 puffs into the lungs every 6 (six) hours as needed for wheezing or  shortness of breath. 03/17/20   Don Perking, [place], MD  cephALEXin (KEFLEX) 500 MG capsule Take 1 capsule (500 mg total) by mouth 2 (two) times daily. 06/05/19   Remus Loffler, PA-C  ciprofloxacin (CIPRO) 500 MG/5ML (10%) suspension Take 5 mLs (500 mg total) by mouth 2 (two) times daily. 03/10/20   Bailey Mech, NP  fluticasone (FLONASE) 50 MCG/ACT nasal spray Place 2 sprays into both nostrils daily. 08/05/19   Daphine Deutscher, Mary-Margaret, FNP  hydrocortisone 1 % lotion Apply 1 application topically 2 (two) times daily. 09/27/19   Arlyn Dunning, PA-C  metroNIDAZOLE (METROGEL VAGINAL) 0.75 % vaginal gel Place 1 Applicatorful vaginally 2 (two) times daily. 06/05/19   Remus Loffler, PA-C  tamsulosin (FLOMAX) 0.4 MG CAPS capsule Take 1 capsule (0.4 mg total) by mouth daily after breakfast. 03/10/20   Bailey Mech, NP    Family History Family History  Problem Relation Age of Onset  . Healthy Mother   . Healthy Father     Social History Social History   Tobacco Use  . Smoking status: Current Every Day Smoker    Packs/day: 1.00  . Smokeless tobacco: Never Used  Vaping Use  . Vaping Use: Never used  Substance Use Topics  . Alcohol use: No  . Drug use: No     Allergies   Patient has no known allergies.   Review of Systems Review of Systems  Constitutional: Positive for chills, fatigue and fever. Negative for diaphoresis.  HENT: Positive for congestion, rhinorrhea and sore throat. Negative for ear pain, postnasal drip, sinus pressure and sinus pain.   Eyes: Negative for pain.  Respiratory: Positive for cough, chest tightness and shortness of breath. Negative for wheezing and stridor.   Cardiovascular: Negative for chest pain and palpitations.  Gastrointestinal: Negative for abdominal pain.  Genitourinary: Negative for dysuria and urgency.  Musculoskeletal: Positive for myalgias.  Skin: Negative for color change, pallor, rash and wound.  Neurological: Negative for dizziness,  light-headedness and headaches.  All other systems reviewed and are negative.    Physical Exam Triage Vital Signs ED Triage Vitals  Enc Vitals Group     BP 05/01/20 1805 137/90     Pulse Rate 05/01/20 1805 (!) 102     Resp 05/01/20 1805 18     Temp 05/01/20 1805 98.9 F (37.2 C)     Temp Source 05/01/20 1805 Oral     SpO2 05/01/20 1805 100 %     Weight 05/01/20 1800 205 lb (93 kg)     Height 05/01/20 1800 5\' 10"  (1.778 m)     Head Circumference --      Peak Flow --      Pain Score 05/01/20 1759 7     Pain Loc --      Pain Edu? --      Excl. in GC? --    No data found.  Updated Vital Signs BP 137/90 (BP Location: Left Arm)   Pulse (!) 102   Temp 98.9 F (37.2 C) (Oral)   Resp 18   Ht 5\' 10"  (1.778 m)   Wt 93 kg   LMP 06/02/2019   SpO2 100%   BMI 29.41 kg/m   Visual Acuity Right Eye Distance:   Left Eye Distance:   Bilateral Distance:    Right Eye Near:   Left Eye Near:    Bilateral Near:     Physical Exam Vitals and nursing note reviewed.  Constitutional:      Appearance: Normal appearance.  HENT:     Head: Normocephalic and atraumatic.     Right Ear: Tympanic membrane normal.     Left Ear: Tympanic membrane normal.     Nose: Congestion and rhinorrhea present.     Mouth/Throat:     Mouth: Mucous membranes are moist.     Pharynx: Posterior oropharyngeal erythema present. No oropharyngeal exudate.  Eyes:     Extraocular Movements: Extraocular movements intact.     Conjunctiva/sclera: Conjunctivae normal.     Pupils: Pupils are equal, round, and reactive to light.  Cardiovascular:     Rate and Rhythm: Regular rhythm. Tachycardia present.     Pulses: Normal pulses.     Heart sounds: Normal heart sounds. No murmur heard. No friction rub. No gallop.   Pulmonary:     Effort: Pulmonary effort is normal. No respiratory distress.     Breath sounds: Normal breath sounds. No stridor. No wheezing, rhonchi or rales.  Musculoskeletal:     Cervical back:  Normal range of motion and neck supple. No rigidity or tenderness.  Lymphadenopathy:     Cervical: Cervical adenopathy present.  Skin:    General: Skin  is warm and dry.     Capillary Refill: Capillary refill takes less than 2 seconds.  Neurological:     General: No focal deficit present.     Mental Status: She is alert and oriented to person, place, and time.      UC Treatments / Results  Labs (all labs ordered are listed, but only abnormal results are displayed) Labs Reviewed  SARS CORONAVIRUS 2 (TAT 6-24 HRS)    EKG   Radiology No results found.  Procedures Procedures (including critical care time)  Medications Ordered in UC Medications - No data to display  Initial Impression / Assessment and Plan / UC Course  I have reviewed the triage vital signs and the nursing notes.  Pertinent labs & imaging results that were available during my care of the patient were reviewed by me and considered in my medical decision making (see chart for details).  Clinical impression: 40 year old female with COVID-like symptoms without vaccine.  She does have COVID exposure.  Treatment plan: 1.  The findings and treatment plan were discussed in detail with the patient.  Patient was in agreement. 2.  I recommended going ahead and getting a COVID test.  It would take between 6 and 24 hours for that to return from the hospital.  In the interim she needs to isolate and use a mask at home. 3.  She stays at home so no work note as needed. 4.  Per CDC guidelines if she is positive she needs to quarantine for 5 days and can come out of quarantine after 5 days if she is asymptomatic. 5.  Plenty of rest, plenty of fluids.  Over-the-counter meds as needed.  Tylenol or Motrin for fever discomfort. 6.  Educational handouts were provided. 7.  Red flag signs and symptoms were discussed in detail and when to seek out immediate medical attention.  Patient voiced verbal understanding. 8.  Follow-up here as  needed.    Final Clinical Impressions(s) / UC Diagnoses   Final diagnoses:  Viral URI with cough  Close exposure to COVID-19 virus  Myalgia  Fever, unspecified     Discharge Instructions     I recommended going ahead and getting a COVID test.  It would take between 6 and 24 hours for that to return from the hospital.  In the interim she needs to isolate and use a mask at home. She stays at home so no work note as needed. Per CDC guidelines if she is positive she needs to quarantine for 5 days and can come out of quarantine after 5 days if she is asymptomatic. Plenty of rest, plenty of fluids.  Over-the-counter meds as needed.  Tylenol or Motrin for fever discomfort. Educational handouts were provided. Red flag signs and symptoms were discussed in detail and when to seek out immediate medical attention.  Patient voiced verbal understanding. Follow-up here as needed.    ED Prescriptions    None     PDMP not reviewed this encounter.   Delton See, MD 05/01/20 1932

## 2020-05-01 NOTE — ED Triage Notes (Signed)
Pt c/o of cough, body aches and low grade temp. Both of her children tested positive for Covid yesterday.

## 2020-05-02 LAB — SARS CORONAVIRUS 2 (TAT 6-24 HRS): SARS Coronavirus 2: POSITIVE — AB

## 2020-05-03 ENCOUNTER — Telehealth: Payer: Self-pay | Admitting: Unknown Physician Specialty

## 2020-05-03 NOTE — Telephone Encounter (Signed)
Called to discuss with patient about COVID-19 symptoms and the use of one of the available treatments for those with mild to moderate Covid symptoms and at a high risk of hospitalization.  Pt appears to qualify for outpatient treatment due to co-morbid conditions and/or a member of an at-risk group in accordance with the FDA Emergency Use Authorization.    Symptom onset: 1/19 Vaccinated: no Booster? No Immunocompromised? 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

## 2020-05-05 ENCOUNTER — Encounter: Payer: Self-pay | Admitting: Emergency Medicine

## 2020-05-05 ENCOUNTER — Other Ambulatory Visit: Payer: Self-pay

## 2020-05-05 ENCOUNTER — Ambulatory Visit
Admission: EM | Admit: 2020-05-05 | Discharge: 2020-05-05 | Disposition: A | Discharge status: Home / Self Care | Payer: Medicaid Other

## 2020-05-05 DIAGNOSIS — R0789 Other chest pain: Secondary | ICD-10-CM | POA: Diagnosis not present

## 2020-05-05 DIAGNOSIS — U071 COVID-19: Secondary | ICD-10-CM

## 2020-05-05 DIAGNOSIS — R059 Cough, unspecified: Secondary | ICD-10-CM | POA: Diagnosis not present

## 2020-05-05 MED ORDER — PREDNISONE 10 MG (21) PO TBPK: ORAL_TABLET | ORAL | 0 refills | Status: DC

## 2020-05-05 NOTE — ED Provider Notes (Signed)
[facility] [place] [facility] [place]   Name: Kayla Howell DOB: Aug 26, 1980 MRN: [id number] CSN: [id number] PCP: Pcp, No  Arrival date and time:  05/05/20 1255  Chief Complaint:  Cough and Chest Pain (COVID+)   NOTE: Prior to seeing the patient today, I have reviewed the triage nursing documentation and vital signs. Clinical staff has updated patient's PMH/PSHx, current medication list, and drug allergies/intolerances to ensure comprehensive history available to assist in medical decision making.   History:   HPI: Kayla Howell is a 40 y.o. female who presents today with complaints of cough and chest congestion.  Patient was diagnosed with COVID-19 4 days ago and she is concerned about her progressive chest congestion/chest pain.  She is treating her symptoms with over-the-counter medicine such as Mucinex and ibuprofen.  She believes her chest pain may be due to coughing or reflux.  She denies any fevers, wheezing, or shortness of breath.   Past Medical History:  Diagnosis Date  . Anxiety   . GERD (gastroesophageal reflux disease)   . Medical history non-contributory     Past Surgical History:  Procedure Laterality Date  . ABDOMINAL HYSTERECTOMY    . NO PAST SURGERIES      Family History  Problem Relation Age of Onset  . Healthy Mother   . Healthy Father     Social History   Tobacco Use  . Smoking status: Current Every Day Smoker    Packs/day: 1.00  . Smokeless tobacco: Never Used  Vaping Use  . Vaping Use: Never used  Substance Use Topics  . Alcohol use: No  . Drug use: No    Patient Active Problem List   Diagnosis Date Noted  . Pregnancy 09/16/2015    Home Medications:    Current Meds  Medication Sig  . busPIRone (BUSPAR) 5 MG tablet Take by mouth.  . citalopram (CELEXA) 20 MG tablet Take 20 mg by mouth daily.  . famotidine (PEPCID) 20 MG tablet Take 1 tablet (20 mg total) by mouth 2 (two) times daily.  . pantoprazole (PROTONIX) 40 MG tablet Take  by mouth.  . predniSONE (STERAPRED UNI-PAK 21 TAB) 10 MG (21) TBPK tablet Take as instructed on package (60, 50, 40, 30, 20, 10)  . sucralfate (CARAFATE) 1 g tablet Take by mouth.  . traZODone (DESYREL) 50 MG tablet     Allergies:   Patient has no known allergies.  Review of Systems (ROS): Review of Systems  Constitutional: Positive for fatigue. Negative for fever.  HENT: Positive for sinus pressure. Negative for sore throat.   Respiratory: Positive for cough and chest tightness. Negative for shortness of breath.   Cardiovascular: Positive for chest pain.  Musculoskeletal: Positive for myalgias.  Neurological: Negative for weakness and headaches.     Vital Signs: Today's Vitals   05/05/20 1310 05/05/20 1315 05/05/20 1417  BP:  (!) 144/81   Pulse:  75   Resp:  14   Temp:  98.6 F (37 C)   TempSrc:  Oral   SpO2:  100%   Weight: 205 lb (93 kg)    Height: 5\' 10"  (1.778 m)    PainSc: 7   7     Physical Exam: Physical Exam Vitals and nursing note reviewed.  Constitutional:      Appearance: Normal appearance. She is not ill-appearing or toxic-appearing.  Cardiovascular:     Rate and Rhythm: Normal rate and regular rhythm.     Pulses: Normal pulses.  Heart sounds: Normal heart sounds.  Pulmonary:     Effort: Pulmonary effort is normal.     Breath sounds: Normal breath sounds. No decreased breath sounds, wheezing, rhonchi or rales.  Chest:     Chest wall: Tenderness present.  Skin:    General: Skin is warm and dry.  Neurological:     General: No focal deficit present.     Mental Status: She is alert and oriented to person, place, and time.  Psychiatric:        Mood and Affect: Mood normal.        Behavior: Behavior normal.      Urgent Care Treatments / Results:   LABS: PLEASE NOTE: all labs that were ordered this encounter are listed, however only abnormal results are displayed. Labs Reviewed - No data to display  EKG: -None  RADIOLOGY: No results  found.  PROCEDURES: Procedures  MEDICATIONS RECEIVED THIS VISIT: Medications - No data to display  PERTINENT CLINICAL COURSE NOTES/UPDATES:   Initial Impression / Assessment and Plan / Urgent Care Course:  Pertinent labs & imaging results that were available during my care of the patient were personally reviewed by me and considered in my medical decision making (see lab/imaging section of note for values and interpretations).  Kayla Howell is a 40 y.o. female who presents to [facility] today with complaints of cough and shortness of breath, diagnosed with chest wall pain and cough secondary to COVID, and treated as such with the directions below. NP and patient reviewed discharge instructions below during visit.   Patient is well appearing overall in clinic today. She does not appear to be in any acute distress. Presenting symptoms (see HPI) and exam as documented above.   I have reviewed the follow up and strict return precautions for any new or worsening symptoms. Patient is aware of symptoms that would be deemed urgent/emergent, and would thus require further evaluation either here or in the emergency department. At the time of discharge, she verbalized understanding and consent with the discharge plan as it was reviewed with her. All questions were fielded by provider and/or clinic staff prior to patient discharge.    Final Clinical Impressions / Urgent Care Diagnoses:   Final diagnoses:  Cough  Chest wall pain  COVID    New Prescriptions:  [place] Controlled Substance Registry consulted? Not Applicable  Meds ordered this encounter  Medications  . predniSONE (STERAPRED UNI-PAK 21 TAB) 10 MG (21) TBPK tablet    Sig: Take as instructed on package (60, 50, 40, 30, 20, 10)    Dispense:  1 each    Refill:  0     Discharge Instructions     You were seen for cough and chest wall soreness and are being treated for cough and COVID-19.   You have COVID-19. Treat your  symptoms with over-the-counter medications such as Tylenol for your muscle pain, and Mucinex for chest congestion. Stay away from NSAIDs such as ibuprofen or naproxen due to your reflux. Use your albuterol as needed for wheezing and chest tightness.  I strongly suggest you receive your COVID-vaccine once recovered.  Take care, Dr. Sharlet Salina, NP-c     Recommended Follow up Care:  Patient encouraged to follow up with the following provider within the specified time frame, or sooner as dictated by the severity of her symptoms. As always, she was instructed that for any urgent/emergent care needs, she should seek care either here or in the emergency department for  more immediate evaluation.   Bailey Mech, DNP, NP-c    Bailey Mech, NP 05/05/20 1705

## 2020-05-05 NOTE — Discharge Instructions (Addendum)
You were seen for cough and chest wall soreness and are being treated for cough and COVID-19.   You have COVID-19. Treat your symptoms with over-the-counter medications such as Tylenol for your muscle pain, and Mucinex for chest congestion. Stay away from NSAIDs such as ibuprofen or naproxen due to your reflux. Use your albuterol as needed for wheezing and chest tightness.  I strongly suggest you receive your COVID-vaccine once recovered.  Take care, Dr. Sharlet Salina, NP-c

## 2020-05-05 NOTE — ED Triage Notes (Signed)
Patient states that she tested positive for COVID on Wed.  Patient reports ongoing cough, congestion and chest pain.  Patient denies fevers.

## 2020-06-06 ENCOUNTER — Ambulatory Visit: Payer: Medicaid Other | Admitting: Gastroenterology

## 2020-06-06 ENCOUNTER — Encounter: Payer: Self-pay | Admitting: *Deleted

## 2020-07-15 ENCOUNTER — Ambulatory Visit: Payer: Medicaid Other | Admitting: Gastroenterology

## 2020-07-15 ENCOUNTER — Other Ambulatory Visit: Payer: Self-pay

## 2020-08-27 ENCOUNTER — Encounter: Payer: Self-pay | Admitting: Emergency Medicine

## 2020-08-27 ENCOUNTER — Other Ambulatory Visit: Payer: Self-pay

## 2020-08-27 DIAGNOSIS — Z79899 Other long term (current) drug therapy: Secondary | ICD-10-CM | POA: Insufficient documentation

## 2020-08-27 DIAGNOSIS — R42 Dizziness and giddiness: Secondary | ICD-10-CM | POA: Diagnosis not present

## 2020-08-27 DIAGNOSIS — F17200 Nicotine dependence, unspecified, uncomplicated: Secondary | ICD-10-CM | POA: Diagnosis not present

## 2020-08-27 DIAGNOSIS — R202 Paresthesia of skin: Secondary | ICD-10-CM | POA: Diagnosis not present

## 2020-08-27 DIAGNOSIS — F419 Anxiety disorder, unspecified: Secondary | ICD-10-CM | POA: Insufficient documentation

## 2020-08-27 DIAGNOSIS — R002 Palpitations: Secondary | ICD-10-CM | POA: Diagnosis not present

## 2020-08-27 DIAGNOSIS — T43205A Adverse effect of unspecified antidepressants, initial encounter: Secondary | ICD-10-CM | POA: Diagnosis not present

## 2020-08-27 LAB — URINALYSIS, COMPLETE (UACMP) WITH MICROSCOPIC
Bacteria, UA: NONE SEEN
Bilirubin Urine: NEGATIVE
Glucose, UA: NEGATIVE mg/dL
Hgb urine dipstick: NEGATIVE
Ketones, ur: NEGATIVE mg/dL
Leukocytes,Ua: NEGATIVE
Nitrite: NEGATIVE
Protein, ur: NEGATIVE mg/dL
Specific Gravity, Urine: 1.004 — ABNORMAL LOW (ref 1.005–1.030)
pH: 6 (ref 5.0–8.0)

## 2020-08-27 LAB — CBC
HCT: 38 % (ref 36.0–46.0)
Hemoglobin: 12.4 g/dL (ref 12.0–15.0)
MCH: 25.5 pg — ABNORMAL LOW (ref 26.0–34.0)
MCHC: 32.6 g/dL (ref 30.0–36.0)
MCV: 78 fL — ABNORMAL LOW (ref 80.0–100.0)
Platelets: 212 10*3/uL (ref 150–400)
RBC: 4.87 MIL/uL (ref 3.87–5.11)
RDW: 14.5 % (ref 11.5–15.5)
WBC: 8.3 10*3/uL (ref 4.0–10.5)
nRBC: 0 % (ref 0.0–0.2)

## 2020-08-27 LAB — BASIC METABOLIC PANEL
Anion gap: 10 (ref 5–15)
BUN: 8 mg/dL (ref 6–20)
CO2: 22 mmol/L (ref 22–32)
Calcium: 9.1 mg/dL (ref 8.9–10.3)
Chloride: 105 mmol/L (ref 98–111)
Creatinine, Ser: 0.54 mg/dL (ref 0.44–1.00)
GFR, Estimated: >60 mL/min (ref >60)
Glucose, Bld: 107 mg/dL — ABNORMAL HIGH (ref 70–99)
Potassium: 3.3 mmol/L — ABNORMAL LOW (ref 3.5–5.1)
Sodium: 137 mmol/L (ref 135–145)

## 2020-08-27 NOTE — ED Triage Notes (Addendum)
Pt arrived via POV with reports dizziness, nausea, also c/o L side pushing and pressure in head, pt also states she feels like her L side of her body goes in and out of numbness and tingling. Pt states these symptoms have been going on for several days.  Pt states she was taking Effexor recently and took last dose on Saturday due to sxs. Pt was on celexa 20mg  x 2 months. PT states she was on the Effexor XR 37.5mg  for 5 days.   Pt also describes that she feels like she is constantly shaking on the inside. Pt also states she has had palpitations.

## 2020-08-28 ENCOUNTER — Emergency Department
Admission: EM | Admit: 2020-08-28 | Discharge: 2020-08-28 | Disposition: A | Discharge status: Home / Self Care | Payer: No Typology Code available for payment source | Attending: Emergency Medicine | Admitting: Emergency Medicine

## 2020-08-28 DIAGNOSIS — F419 Anxiety disorder, unspecified: Secondary | ICD-10-CM

## 2020-08-28 DIAGNOSIS — T887XXA Unspecified adverse effect of drug or medicament, initial encounter: Secondary | ICD-10-CM

## 2020-08-28 LAB — MAGNESIUM: Magnesium: 2.1 mg/dL (ref 1.7–2.4)

## 2020-08-28 MED ORDER — POTASSIUM CHLORIDE 20 MEQ PO PACK
40.0000 meq | PACK | ORAL | Status: AC
  Administered 2020-08-28: 40 meq via ORAL
  Filled 2020-08-28: qty 2

## 2020-08-28 NOTE — Discharge Instructions (Signed)
Your workup in the Emergency Department today was reassuring.  We did not find any specific abnormalities.  As we discussed, we agreed that your symptoms are most likely caused either by side effects to the medications you were taking, anxiety/panic attacks, or combination of both.  We recommend you follow up with the doctor(s) listed in these documents as recommended.  Return to the Emergency Department if you develop new or worsening symptoms that concern you.

## 2020-08-28 NOTE — ED Notes (Signed)
This RN agrees with triage note. Pt states she started taking Effexor XR, was on it for about 5 days then began having dizziness, palpitations and L sided "shocking" sensation. Last dose was on Sunday. States she doesn't know for sure if it was the medication but states she did not have any of these symptoms prior to starting medication. Pt gowned, placed on monitor. AAO NAD VSS

## 2020-08-28 NOTE — ED Provider Notes (Signed)
[facility] Emergency Department Provider Note  ____________________________________________   Event Date/Time   First MD Initiated Contact with Patient 08/28/20 [phone]     (approximate)  I have reviewed the triage vital signs and the nursing notes.   HISTORY  Chief Complaint Dizziness and Numbness    HPI Kayla Howell is a 40 y.o. female who presents with a variety of complaints and concerns that she believes might be related to medications.  She said that she has been struggling with issues with acid reflux for more than a month.  After seeing a gastroenterologist, the GI doctor recommended the patient try Effexor because anxiety and panic attacks are a big problem for her and he thought that it might be exacerbating the GI symptoms.  She was on the medication for little while but did not like the way it made her feel.  After she stopped taking the medicine, she started belting symptoms most notable on the left side of her body including intermittent numbness and tingling, occasional pain, feeling some palpitations in her chest, and once feeling like her eye was "jumpy".  She sometimes felt the symptoms in the left arm and her left leg.   She feels better now but was concerned about the constellation of symptoms.  She has had no weakness in her extremities.  She has had no altered mental status.  She does not take anything currently for her anxiety.  She denies shortness of breath, nausea, vomiting, and abdominal pain.  No visual loss.  No word finding difficulties.  Symptoms are at times severe and nothing in particular makes them better or worse.        Past Medical History:  Diagnosis Date  . Anxiety   . GERD (gastroesophageal reflux disease)   . Medical history non-contributory     Patient Active Problem List   Diagnosis Date Noted  . Dysphagia 04/09/2020  . Weight loss 04/09/2020  . Anxiety 02/23/2020  . Gastroesophageal reflux disease 01/28/2020   . Somatic dysfunction of rib 01/28/2020  . Pregnancy 09/16/2015  . Fe deficiency anemia 05/07/2015  . Late prenatal care 05/06/2015  . Obesity (BMI 30-39.9) 05/06/2015  . Oligohydramnios, delivered 05/06/2015  . Postpartum depression 05/06/2015  . Tobacco smoking affecting pregnancy in second trimester 05/06/2015    Past Surgical History:  Procedure Laterality Date  . ABDOMINAL HYSTERECTOMY    . NO PAST SURGERIES      Prior to Admission medications   Medication Sig Start Date End Date Taking? Authorizing Provider  albuterol (VENTOLIN HFA) 108 (90 Base) MCG/ACT inhaler Inhale 2 puffs into the lungs every 6 (six) hours as needed for wheezing or shortness of breath. 03/17/20   Nita Sickle, MD  alum & mag hydroxide-simeth (MAALOX MAX) 400-400-40 MG/5ML suspension Take 5 mLs by mouth every 6 (six) hours as needed for indigestion. 03/17/20   Nita Sickle, MD  busPIRone (BUSPAR) 5 MG tablet Take by mouth. 04/16/20   [provider]  citalopram (CELEXA) 20 MG tablet Take 20 mg by mouth daily.    [provider]  cyclobenzaprine (FLEXERIL) 5 MG tablet Take 1 tablet by mouth daily. 10/18/18   [provider]  famotidine (PEPCID) 20 MG tablet Take 1 tablet (20 mg total) by mouth 2 (two) times daily. 06/03/19   Irean Hong, MD  fluticasone (FLONASE) 50 MCG/ACT nasal spray Place 2 sprays into both nostrils daily. 08/05/19   Daphine Deutscher Mary-Margaret, FNP  hydrocortisone 1 % lotion Apply 1  application topically 2 (two) times daily. 09/27/19   Ronnie DossMcLean, Kelly A, PA-C  hydrOXYzine (ATARAX/VISTARIL) 25 MG tablet Take 25 mg by mouth daily. 03/21/20   [provider]  Iron-FA-B Cmp-C-Biot-Probiotic (FUSION PLUS) CAPS Take 1 capsule by mouth daily. 05/07/15   [provider]  LORazepam (ATIVAN) 1 MG tablet Take 1 mg by mouth daily as needed. 04/07/20   [provider]  medroxyPROGESTERone (PROVERA) 10 MG tablet Take 2 tablets by mouth daily. 01/12/19    [provider]  metroNIDAZOLE (METROGEL VAGINAL) 0.75 % vaginal gel Place 1 Applicatorful vaginally 2 (two) times daily. 06/05/19   Remus LofflerJones, Angel S, PA-C  naproxen (NAPROSYN) 500 MG tablet Take 500 mg by mouth 2 (two) times daily. 05/19/20   [provider]  pantoprazole (PROTONIX) 40 MG tablet Take by mouth. 12/29/19 01/11/21  [provider]  predniSONE (STERAPRED UNI-PAK 21 TAB) 10 MG (21) TBPK tablet Take as instructed on package (60, 50, 40, 30, 20, 10) 05/05/20   Bailey MechBenjamin, Lunise, NP  sucralfate (CARAFATE) 1 g tablet Take by mouth. 04/16/20 05/16/20  [provider]  tamsulosin (FLOMAX) 0.4 MG CAPS capsule Take 1 capsule (0.4 mg total) by mouth daily after breakfast. 03/10/20   Bailey MechBenjamin, Lunise, NP  tranexamic acid (LYSTEDA) 650 MG TABS tablet PLEASE SEE ATTACHED FOR DETAILED DIRECTIONS 01/26/19   [provider]  traZODone (DESYREL) 50 MG tablet  05/01/20   [provider]    Allergies Patient has no known allergies.  Family History  Problem Relation Age of Onset  . Healthy Mother   . Healthy Father     Social History Social History   Tobacco Use  . Smoking status: Current Every Day Smoker    Packs/day: 1.00  . Smokeless tobacco: Never Used  Vaping Use  . Vaping Use: Never used  Substance Use Topics  . Alcohol use: No  . Drug use: No    Review of Systems Constitutional: No fever/chills Eyes: No visual changes.   ENT: No sore throat. Cardiovascular: Occasional palpitations.  Denies chest pain. Respiratory: Denies shortness of breath. Gastrointestinal: No abdominal pain.  No nausea, no vomiting.  No diarrhea.  No constipation. Genitourinary: Negative for dysuria. Musculoskeletal: Negative for neck pain.  Negative for back pain. Integumentary: Negative for rash. Neurological: Intermittent symptoms including numbness, tingling, occasional pain, this seems to be mostly on the left side of her body and  face.   ____________________________________________   PHYSICAL EXAM:  VITAL SIGNS: ED Triage Vitals  Enc Vitals Group     BP 08/27/20 2220 140/89     Pulse Rate 08/27/20 2220 74     Resp 08/27/20 2220 20     Temp 08/27/20 2220 98 F (36.7 C)     Temp Source 08/27/20 2220 Oral     SpO2 08/27/20 2220 99 %     Weight 08/27/20 2220 93.9 kg (207 lb)     Height 08/27/20 2220 1.778 m (5\' 10" )     Head Circumference --      Peak Flow --      Pain Score 08/27/20 2231 6     Pain Loc --      Pain Edu? --      Excl. in GC? --     Constitutional: Alert and oriented.  Eyes: Conjunctivae are normal.  Pupils are normal and reactive.  No nystagmus.  Normal extraocular movement. Head: Atraumatic. Nose: No congestion/rhinnorhea. Mouth/Throat: Patient is wearing a mask. Neck: No stridor.  No meningeal  signs.   Cardiovascular: Normal rate, regular rhythm. Good peripheral circulation. Respiratory: Normal respiratory effort.  No retractions. Gastrointestinal: Soft and nontender. No distention.  Musculoskeletal: No lower extremity tenderness nor edema. No gross deformities of extremities. Neurologic:  Normal speech and language. No gross focal neurologic deficits are appreciated.  Normal bilateral triceps reflex and patellar reflexes with no clonus.  Patient has no rigidity either.  No appreciable cranial nerve deficits. Skin:  Skin is warm, dry and intact. Psychiatric: Mood and affect are normal. Speech and behavior are normal.  ____________________________________________   LABS (all labs ordered are listed, but only abnormal results are displayed)  Labs Reviewed  BASIC METABOLIC PANEL - Abnormal; Notable for the following components:      Result Value   Potassium 3.3 (*)    Glucose, Bld 107 (*)    All other components within normal limits  CBC - Abnormal; Notable for the following components:   MCV 78.0 (*)    MCH 25.5 (*)    All other components within normal limits  URINALYSIS,  COMPLETE (UACMP) WITH MICROSCOPIC - Abnormal; Notable for the following components:   Color, Urine YELLOW (*)    APPearance CLEAR (*)    Specific Gravity, Urine 1.004 (*)    All other components within normal limits  MAGNESIUM   ____________________________________________  EKG  ED ECG REPORT I, Loleta Rose, the attending physician, personally viewed and interpreted this ECG.  Date: 08/27/2020 EKG Time: 22: 24 Rate: 73 Rhythm: normal sinus rhythm QRS Axis: normal Intervals: normal ST/T Wave abnormalities: normal Narrative Interpretation: no evidence of acute ischemia ____________________________________________   INITIAL IMPRESSION / MDM / ASSESSMENT AND PLAN / ED COURSE  As part of my medical decision making, I reviewed the following data within the electronic MEDICAL RECORD NUMBER Nursing notes reviewed and incorporated, Labs reviewed , EKG interpreted , Old chart reviewed and Notes from prior ED visits   Differential diagnosis includes, but is not limited to, anxiety, medication side effect, serotonin syndrome, neuroleptic malignant syndrome, electrolyte or metabolic abnormality, CVA/TIA.  Patient is well-appearing and in no distress.  Due to overwhelming ED volume tonight, the patient waited for a few hours and felt better by the time I saw her.  I performed a thorough neurological exam and I do not appreciate any focal neurological deficits.  Her lab work is reassuring other than some mild hypokalemia which I do not believe explains her symptoms.  EKG is reassuring without any evidence of ischemia.  I provided reassurance to the patient and explained that I think her symptoms are likely due to anxiety/panic attacks as well as possible medication side effects.  She has already stopped the medications.  There is no sign she is experiencing an emergency such as serotonin syndrome or NMS.  We discussed the possibility of getting an MRI of the brain but I explained that I think it is  extremely unlikely she is having a CVA given the intermittent episodes that seem more attributable to anxiety.  She agrees and does not want to proceed with the MRI at this time.  She is well-established with primary care and will follow up with her PCP to discuss additional recommendations and evaluation options.  I gave my usual and customary return precautions.           ____________________________________________  FINAL CLINICAL IMPRESSION(S) / ED DIAGNOSES  Final diagnoses:  Anxiety  Medication side effect     MEDICATIONS GIVEN DURING THIS VISIT:  Medications  potassium chloride (  KLOR-CON) packet 40 mEq (40 mEq Oral Given 08/28/20 0348)     ED Discharge Orders    None      *Please note:  Lucerito P Riggle was evaluated in Emergency Department on 08/28/2020 for the symptoms described in the history of present illness. She was evaluated in the context of the global COVID-19 pandemic, which necessitated consideration that the patient might be at risk for infection with the SARS-CoV-2 virus that causes COVID-19. Institutional protocols and algorithms that pertain to the evaluation of patients at risk for COVID-19 are in a state of rapid change based on information released by regulatory bodies including the CDC and federal and state organizations. These policies and algorithms were followed during the patient's care in the ED.  Some ED evaluations and interventions may be delayed as a result of limited staffing during and after the pandemic.*  Note:  This document was prepared using Dragon voice recognition software and may include unintentional dictation errors.   Loleta Rose, MD 08/28/20 [phone]

## 2020-09-03 ENCOUNTER — Ambulatory Visit (INDEPENDENT_AMBULATORY_CARE_PROVIDER_SITE_OTHER): Payer: Medicaid Other | Admitting: Gastroenterology

## 2020-09-03 ENCOUNTER — Other Ambulatory Visit: Payer: Self-pay

## 2020-09-03 ENCOUNTER — Encounter: Payer: Self-pay | Admitting: Gastroenterology

## 2020-09-03 VITALS — BP 148/90 | HR 88 | Wt 205.0 lb

## 2020-09-03 DIAGNOSIS — K224 Dyskinesia of esophagus: Secondary | ICD-10-CM

## 2020-09-03 DIAGNOSIS — R12 Heartburn: Secondary | ICD-10-CM

## 2020-09-03 DIAGNOSIS — K5900 Constipation, unspecified: Secondary | ICD-10-CM | POA: Diagnosis not present

## 2020-09-03 MED ORDER — OMEPRAZOLE 20 MG PO CPDR: 20.0000 mg | DELAYED_RELEASE_CAPSULE | Freq: Two times a day (BID) | ORAL | 0 refills | Status: DC

## 2020-09-03 NOTE — Progress Notes (Signed)
Kayla Howell [address]  [address]  [place], [place] [address]  Main: [phone]  Fax: [phone]   Gastroenterology Consultation  Referring Provider:     Val Riles, MD Primary Care Physician:  Orland Mustard, MD Reason for Consultation:     Heartburn, constipation        HPI:    Chief Complaint  Patient presents with  . New Patient (Initial Visit)    Wants 2nd opinion---was going to [facility], Barium swallow 08/14/20 Dx GERD, EGD 02/2020    Kayla Howell is a 40 y.o. y/o female referred for consultation & management  by Dr. Orland Mustard, MD.  Patient recently seen by [facility], most recently on 08/06/2020, and diagnosed with jackhammer esophagus, presents for second opinion.  Reports ongoing reflux symptoms including metallic taste in mouth on laying down, atypical chest pain, despite taking Protonix twice daily and Pepcid as needed.  No weight loss, no nausea or vomiting.  Does also report constipation and does not have a bowel movement for 2 to 3 days at a time.  Does not take anything for her bowels.  No blood in stool.  No family history of colon cancer.  No prior colonoscopy.  [facility] note states the patient underwent esophageal manometry that showed jackhammer esophagus.  24-hour pH study was unable to be completed due to significant anxiety with the manometry probe and subsequent pH probe.  Patient has also been to the ER multiple times due to her symptoms.  Barium swallow on Aug 14, 2020 shows gastroesophageal reflux to the most esophagus.  Otherwise normal.  Upper endoscopy in 2021 at [facility] was normal.  As per the [facility] office note:  "I explained that jackhammer esophagus and other functional disorders are increasingly common and are chronic conditions that are believed to be caused by underlying visceral hypersensitivity, intestinal dysmotility and/or brain-gut dysfunction. I reinforced that these conditions are real and fortunately are not dangerous.  Because they are not curable the focus is on management to improve quality of life, however it is important to keep realistic expectations as symptoms are likely to wax and wane without 100% improvement. Treatment options include a combination of medication and non-medication therapies. Medications frequently used include antispasmodics and antidepressants such as TCAs and SNRIs to manage symptoms.   PLAN:  1. Call radiology to schedule barium and timed barium swallow.  2. Trial of SL NTG for pain. If helpful can switch to Imdur (longer acting so less pills to take).  3. May need to consider trying to complete 24 hour pHMII if considering POEM vs heller myotomy.  4. PCP can consider increasing buspar or celexa for anxiety and to treat GI symptoms, OR could switch to Effexor which works very well in patients with jackhammer esophagus.  5. Avoid narcotics as this will worsen spasticity. Can use tylenol and peppermint oil as an alternative.  6. Follow up in 3 months.  7. My contact information was provided and she will call with any questions or concerns in the interim.  "  Patient states the Effexor did not help and she stopped taking it after 5 days.  She states she does not do well with meds.  Past Medical History:  Diagnosis Date  . Anxiety   . GERD (gastroesophageal reflux disease)   . Medical history non-contributory     Past Surgical History:  Procedure Laterality Date  . ABDOMINAL HYSTERECTOMY    . NO PAST SURGERIES  Prior to Admission medications   Medication Sig Start Date End Date Taking? Authorizing Provider  alum & mag hydroxide-simeth (MAALOX MAX) 400-400-40 MG/5ML suspension Take 5 mLs by mouth every 6 (six) hours as needed for indigestion. 03/17/20  Yes Veronese, [place], MD  busPIRone (BUSPAR) 5 MG tablet Take by mouth. 04/16/20  Yes [provider]  cyclobenzaprine (FLEXERIL) 5 MG tablet Take 1 tablet by mouth daily. 10/18/18  Yes [provider]   famotidine (PEPCID) 20 MG tablet Take 1 tablet (20 mg total) by mouth 2 (two) times daily. 06/03/19  Yes Irean Hong, MD  fluticasone (FLONASE) 50 MCG/ACT nasal spray Place 2 sprays into both nostrils daily. 08/05/19  Yes Martin, Mary-Margaret, FNP  hydrocortisone 1 % lotion Apply 1 application topically 2 (two) times daily. 09/27/19  Yes Ronnie Doss A, PA-C  hydrOXYzine (ATARAX/VISTARIL) 25 MG tablet Take 25 mg by mouth daily. 03/21/20  Yes [provider]  Iron-FA-B Cmp-C-Biot-Probiotic (FUSION PLUS) CAPS Take 1 capsule by mouth daily. 05/07/15  Yes [provider]  LORazepam (ATIVAN) 1 MG tablet Take 1 mg by mouth daily as needed. 04/07/20  Yes [provider]  naproxen (NAPROSYN) 500 MG tablet Take 500 mg by mouth 2 (two) times daily. 05/19/20  Yes [provider]  omeprazole (PRILOSEC) 20 MG capsule Take 1 capsule (20 mg total) by mouth in the morning and at bedtime for 28 days. 09/03/20 10/01/20 Yes Kayla Howell B, MD  tamsulosin (FLOMAX) 0.4 MG CAPS capsule Take 1 capsule (0.4 mg total) by mouth daily after breakfast. 03/10/20  Yes Bailey Mech, NP  tranexamic acid (LYSTEDA) 650 MG TABS tablet PLEASE SEE ATTACHED FOR DETAILED DIRECTIONS 01/26/19  Yes [provider]  traZODone (DESYREL) 50 MG tablet  05/01/20  Yes [provider]  citalopram (CELEXA) 20 MG tablet Take 20 mg by mouth daily. Patient not taking: Reported on 09/03/2020    [provider]  sucralfate (CARAFATE) 1 g tablet Take by mouth. 04/16/20 05/16/20  [provider]    Family History  Problem Relation Age of Onset  . Healthy Mother   . Healthy Father      Social History   Tobacco Use  . Smoking status: Current Every Day Smoker    Packs/day: 1.00  . Smokeless tobacco: Never Used  Vaping Use  . Vaping Use: Never used  Substance Use Topics  . Alcohol use: No  . Drug use: No    Allergies as of 09/03/2020  . (No Known Allergies)    Review  of Systems:    All systems reviewed and negative except where noted in HPI.   Physical Exam:  BP (!) 148/90   Pulse 88   Wt 205 lb (93 kg)   LMP 06/02/2019   BMI 29.41 kg/m  Patient's last menstrual period was 06/02/2019. Psych:  Alert and cooperative. Normal mood and affect. General:   Alert,  Well-developed, well-nourished, pleasant and cooperative in NAD Head:  Normocephalic and atraumatic. Eyes:  Sclera clear, no icterus.   Conjunctiva pink. Ears:  Normal auditory acuity. Nose:  No deformity, discharge, or lesions. Mouth:  No deformity or lesions,oropharynx pink & moist. Neck:  Supple; no masses or thyromegaly. Abdomen:  Normal bowel sounds.  No bruits.  Soft, non-tender and non-distended without masses, hepatosplenomegaly or hernias noted.  No guarding or rebound tenderness.    Msk:  Symmetrical without gross deformities. Good, equal movement & strength bilaterally. Pulses:  Normal pulses noted. Extremities:  No clubbing or  edema.  No cyanosis. Neurologic:  Alert and oriented x3;  grossly normal neurologically. Skin:  Intact without significant lesions or rashes. No jaundice. Lymph Nodes:  No significant cervical adenopathy. Psych:  Alert and cooperative. Normal mood and affect.   Labs: CBC    Component Value Date/Time   WBC 8.3 08/27/2020 2230   RBC 4.87 08/27/2020 2230   HGB 12.4 08/27/2020 2230   HGB 10.6 (L) 08/08/2014 1823   HCT 38.0 08/27/2020 2230   HCT 29.1 (L) 08/10/2014 0521   PLT 212 08/27/2020 2230   PLT 218 08/09/2014 0638   MCV 78.0 (L) 08/27/2020 2230   MCV 74 (L) 08/08/2014 1823   MCH 25.5 (L) 08/27/2020 2230   MCHC 32.6 08/27/2020 2230   RDW 14.5 08/27/2020 2230   RDW 15.7 (H) 08/08/2014 1823   LYMPHSABS 2.1 11/27/2016 1346   LYMPHSABS 2.3 08/08/2014 1823   MONOABS 0.6 11/27/2016 1346   MONOABS 0.9 08/08/2014 1823   EOSABS 0.2 11/27/2016 1346   EOSABS 0.1 08/08/2014 1823   BASOSABS 0.1 11/27/2016 1346   BASOSABS 0.1 08/08/2014 1823    CMP     Component Value Date/Time   NA 137 08/27/2020 2230   NA 136 01/31/2014 1502   K 3.3 (L) 08/27/2020 2230   K 3.9 01/31/2014 1502   CL 105 08/27/2020 2230   CL 109 (H) 01/31/2014 1502   CO2 22 08/27/2020 2230   CO2 18 (L) 01/31/2014 1502   GLUCOSE 107 (H) 08/27/2020 2230   GLUCOSE 90 04/19/2014 1801   BUN 8 08/27/2020 2230   BUN 7 01/31/2014 1502   CREATININE 0.54 08/27/2020 2230   CREATININE 0.35 (L) 01/31/2014 1502   CALCIUM 9.1 08/27/2020 2230   CALCIUM 7.9 (L) 01/31/2014 1502   PROT 7.6 03/16/2020 2135   PROT 7.0 01/31/2014 1502   ALBUMIN 4.4 03/16/2020 2135   ALBUMIN 3.2 (L) 01/31/2014 1502   AST 17 03/16/2020 2135   AST 19 01/31/2014 1502   ALT 14 03/16/2020 2135   ALT 16 01/31/2014 1502   ALKPHOS 80 03/16/2020 2135   ALKPHOS 92 01/31/2014 1502   BILITOT 0.5 03/16/2020 2135   BILITOT 0.1 (L) 01/31/2014 1502   GFRNONAA >60 08/27/2020 2230   GFRNONAA >60 01/31/2014 1502   GFRAA >60 06/03/2019 0205   GFRAA >60 01/31/2014 1502    Imaging Studies: No results found.  Assessment and Plan:   Kayla Howell is a 40 y.o. y/o female has been referred for second opinion after seeing [facility] for atypical chest pain, reflux, abdominal pain, with patient admitting to significant panic attacks  Previous records from [facility] personally reviewed and agree with their assessment for need of possible pH monitoring study given ongoing symptoms despite PPI.  It appears that the procedure was aborted due to patient's significant anxiety with the probe.  Given ongoing symptoms on PPI and patient unable to complete pH study at this time, will change PPI to see if it helps her symptoms any better  I did discuss that [facility] assessment was considering POEM for jackhammer esophagus and if her symptoms continue, and since she did not do well with Effexor or Celexa, we may need to refer her back to them as these procedures are not done here.  She verbalized understanding of this as  well.  Patient educated extensively on acid reflux lifestyle modification, including buying a bed wedge, not eating 3 hrs before bedtime, diet modifications, and handout given for the same.   High-fiber diet MiraLAX  daily with goal of 1-2 soft bowel movements daily for constipation.  If not at goal, patient instructed to increase dose to twice daily.  If loose stools with the medication, patient asked to decrease the medication to every other day, or half dose daily.  Patient verbalized understanding  No alarm symptoms present at this time to indicate repeat upper endoscopy.    Dr Kayla BouillonVarnita Taneya Conkel  Speech recognition software was used to dictate the above note.

## 2020-09-17 ENCOUNTER — Other Ambulatory Visit: Payer: Self-pay | Admitting: Gastroenterology

## 2020-09-26 ENCOUNTER — Emergency Department: Payer: Medicaid Other

## 2020-09-26 ENCOUNTER — Other Ambulatory Visit: Payer: Self-pay

## 2020-09-26 ENCOUNTER — Emergency Department
Admission: EM | Admit: 2020-09-26 | Discharge: 2020-09-26 | Disposition: A | Discharge status: Home / Self Care | Payer: Medicaid Other | Attending: Emergency Medicine | Admitting: Emergency Medicine

## 2020-09-26 DIAGNOSIS — F17210 Nicotine dependence, cigarettes, uncomplicated: Secondary | ICD-10-CM | POA: Insufficient documentation

## 2020-09-26 DIAGNOSIS — K224 Dyskinesia of esophagus: Secondary | ICD-10-CM | POA: Insufficient documentation

## 2020-09-26 DIAGNOSIS — R072 Precordial pain: Secondary | ICD-10-CM | POA: Diagnosis present

## 2020-09-26 LAB — TROPONIN I (HIGH SENSITIVITY)
Troponin I (High Sensitivity): <2 ng/L (ref <18)
Troponin I (High Sensitivity): <2 ng/L (ref <18)

## 2020-09-26 LAB — BASIC METABOLIC PANEL
Anion gap: 8 (ref 5–15)
BUN: 9 mg/dL (ref 6–20)
CO2: 24 mmol/L (ref 22–32)
Calcium: 9 mg/dL (ref 8.9–10.3)
Chloride: 105 mmol/L (ref 98–111)
Creatinine, Ser: 0.68 mg/dL (ref 0.44–1.00)
GFR, Estimated: >60 mL/min (ref >60)
Glucose, Bld: 131 mg/dL — ABNORMAL HIGH (ref 70–99)
Potassium: 3.5 mmol/L (ref 3.5–5.1)
Sodium: 137 mmol/L (ref 135–145)

## 2020-09-26 LAB — CBC
HCT: 37.3 % (ref 36.0–46.0)
Hemoglobin: 12.2 g/dL (ref 12.0–15.0)
MCH: 25.6 pg — ABNORMAL LOW (ref 26.0–34.0)
MCHC: 32.7 g/dL (ref 30.0–36.0)
MCV: 78.4 fL — ABNORMAL LOW (ref 80.0–100.0)
Platelets: 234 10*3/uL (ref 150–400)
RBC: 4.76 MIL/uL (ref 3.87–5.11)
RDW: 14.6 % (ref 11.5–15.5)
WBC: 5.9 10*3/uL (ref 4.0–10.5)
nRBC: 0 % (ref 0.0–0.2)

## 2020-09-26 MED ORDER — NITROGLYCERIN 0.3 MG SL SUBL: 0.3000 mg | SUBLINGUAL_TABLET | SUBLINGUAL | 0 refills | Status: AC | PRN

## 2020-09-26 MED ORDER — NITROGLYCERIN 0.4 MG SL SUBL
0.4000 mg | SUBLINGUAL_TABLET | SUBLINGUAL | Status: DC | PRN
  Administered 2020-09-26: 0.4 mg via SUBLINGUAL
  Filled 2020-09-26: qty 1

## 2020-09-26 NOTE — ED Triage Notes (Signed)
Pt to ER via POV with complaints of centralized chest pain that radiates to left side of jaw and left shoulder. Reports on going symptoms for four days.   Denies shortness of breath or nausea. Skin warm and dry. NAD at this time.

## 2020-09-26 NOTE — ED Notes (Signed)
Lab notified for blood draw

## 2020-09-26 NOTE — ED Provider Notes (Signed)
[facility] Emergency Department Provider Note  ____________________________________________   Event Date/Time   First MD Initiated Contact with Patient 09/26/20 1206     (approximate)  I have reviewed the triage vital signs and the nursing notes.   HISTORY  Chief Complaint Chest Pain    HPI Kayla Howell is a 40 y.o. female here with chest pain.  The patient states that intermittently, for the last several weeks to months, she has had chest pain.  She has been seen multiple times for this and was prescribed nitroglycerin and antidepressants for jackhammer esophagus and anxiety.  She states that she recently was switched from Celexa to Effexor and this seemed to make her symptoms worse.  She has since switched back to Celexa over the last several days.  This seems to be helping her anxiety.  However, she states that she is having increasingly frequent episodes in which she feels a tight, spasm-like pain in her epigastric and substernal area.  This occasionally radiates towards her left shoulder.  She has associated shortness of breath, then feels like she begins to become very anxious and like she cannot breathe.  She occasionally feels nauseous with this.  Symptoms come and go.  They do seem worse after eating and lying flat.  They are also worse when she is anxious or stressed.     Past Medical History:  Diagnosis Date   Anxiety    GERD (gastroesophageal reflux disease)    Medical history non-contributory     Patient Active Problem List   Diagnosis Date Noted   Dysphagia 04/09/2020   Weight loss 04/09/2020   Anxiety 02/23/2020   Gastroesophageal reflux disease 01/28/2020   Somatic dysfunction of rib 01/28/2020   Pregnancy 09/16/2015   Fe deficiency anemia 05/07/2015   Late prenatal care 05/06/2015   Obesity (BMI 30-39.9) 05/06/2015   Oligohydramnios, delivered 05/06/2015   Postpartum depression 05/06/2015   Tobacco smoking affecting pregnancy  in second trimester 05/06/2015    Past Surgical History:  Procedure Laterality Date   ABDOMINAL HYSTERECTOMY     NO PAST SURGERIES      Prior to Admission medications   Medication Sig Start Date End Date Taking? Authorizing Provider  nitroGLYCERIN (NITROSTAT) 0.3 MG SL tablet Place 1 tablet (0.3 mg total) under the tongue every 5 (five) minutes as needed (chest pain/esophageal spasm, up to 3 times). 09/26/20  Yes Shaune Pollack, MD  alum & mag hydroxide-simeth (MAALOX MAX) 400-400-40 MG/5ML suspension Take 5 mLs by mouth every 6 (six) hours as needed for indigestion. 03/17/20   Nita Sickle, MD  busPIRone (BUSPAR) 5 MG tablet Take by mouth. 04/16/20   [provider]  citalopram (CELEXA) 20 MG tablet Take 20 mg by mouth daily. Patient not taking: Reported on 09/03/2020    [provider]  cyclobenzaprine (FLEXERIL) 5 MG tablet Take 1 tablet by mouth daily. 10/18/18   [provider]  famotidine (PEPCID) 20 MG tablet Take 1 tablet (20 mg total) by mouth 2 (two) times daily. 06/03/19   Irean Hong, MD  fluticasone (FLONASE) 50 MCG/ACT nasal spray Place 2 sprays into both nostrils daily. 08/05/19   Daphine Deutscher, Mary-Margaret, FNP  hydrocortisone 1 % lotion Apply 1 application topically 2 (two) times daily. 09/27/19   Ronnie Doss A, PA-C  hydrOXYzine (ATARAX/VISTARIL) 25 MG tablet Take 25 mg by mouth daily. 03/21/20   [provider]  Iron-FA-B Cmp-C-Biot-Probiotic (FUSION PLUS) CAPS Take 1 capsule by mouth daily. 05/07/15  [provider]  LORazepam (ATIVAN) 1 MG tablet Take 1 mg by mouth daily as needed. 04/07/20   [provider]  naproxen (NAPROSYN) 500 MG tablet Take 500 mg by mouth 2 (two) times daily. 05/19/20   [provider]  omeprazole (PRILOSEC) 20 MG capsule Take 1 capsule (20 mg total) by mouth in the morning and at bedtime for 28 days. 09/03/20 10/01/20  Pasty Spillers, MD  sucralfate (CARAFATE) 1 g tablet Take by mouth.  04/16/20 05/16/20  [provider]  tamsulosin (FLOMAX) 0.4 MG CAPS capsule Take 1 capsule (0.4 mg total) by mouth daily after breakfast. 03/10/20   Bailey Mech, NP  tranexamic acid (LYSTEDA) 650 MG TABS tablet PLEASE SEE ATTACHED FOR DETAILED DIRECTIONS 01/26/19   [provider]  traZODone (DESYREL) 50 MG tablet  05/01/20   [provider]    Allergies Patient has no known allergies.  Family History  Problem Relation Age of Onset   Healthy Mother    Healthy Father     Social History Social History   Tobacco Use   Smoking status: Every Day    Packs/day: 1.00    Pack years: 0.00    Types: Cigarettes   Smokeless tobacco: Never  Vaping Use   Vaping Use: Never used  Substance Use Topics   Alcohol use: No   Drug use: No    Review of Systems  Review of Systems  Constitutional:  Positive for fatigue. Negative for fever.  HENT:  Negative for congestion and sore throat.   Eyes:  Negative for visual disturbance.  Respiratory:  Positive for chest tightness and shortness of breath. Negative for cough.   Cardiovascular:  Positive for chest pain.  Gastrointestinal:  Negative for abdominal pain, diarrhea, nausea and vomiting.  Genitourinary:  Negative for flank pain.  Musculoskeletal:  Negative for back pain and neck pain.  Skin:  Negative for rash and wound.  Neurological:  Negative for weakness.  All other systems reviewed and are negative.   ____________________________________________  PHYSICAL EXAM:      VITAL SIGNS: ED Triage Vitals  Enc Vitals Group     BP 09/26/20 1139 127/86     Pulse Rate 09/26/20 1139 86     Resp 09/26/20 1139 18     Temp 09/26/20 1139 98.2 F (36.8 C)     Temp Source 09/26/20 1139 Oral     SpO2 09/26/20 1139 98 %     Weight 09/26/20 1140 205 lb (93 kg)     Height 09/26/20 1140 5\' 10"  (1.778 m)     Head Circumference --      Peak Flow --      Pain Score 09/26/20 1139 6     Pain Loc --      Pain Edu? --       Excl. in GC? --      Physical Exam Vitals and nursing note reviewed.  Constitutional:      General: She is not in acute distress.    Appearance: She is well-developed.  HENT:     Head: Normocephalic and atraumatic.  Eyes:     Conjunctiva/sclera: Conjunctivae normal.  Cardiovascular:     Rate and Rhythm: Normal rate and regular rhythm.     Heart sounds: Normal heart sounds. No murmur heard.   No friction rub.  Pulmonary:     Effort: Pulmonary effort is normal. No respiratory distress.     Breath sounds: Normal breath sounds. No wheezing or rales.  Abdominal:     General: There is no distension.     Palpations: Abdomen is soft.     Tenderness: There is no abdominal tenderness.  Musculoskeletal:     Cervical back: Neck supple.  Skin:    General: Skin is warm.     Capillary Refill: Capillary refill takes less than 2 seconds.  Neurological:     Mental Status: She is alert and oriented to person, place, and time.     Motor: No abnormal muscle tone.  Psychiatric:        Mood and Affect: Mood is anxious.      ____________________________________________   LABS (all labs ordered are listed, but only abnormal results are displayed)  Labs Reviewed  BASIC METABOLIC PANEL - Abnormal; Notable for the following components:      Result Value   Glucose, Bld 131 (*)    All other components within normal limits  CBC - Abnormal; Notable for the following components:   MCV 78.4 (*)    MCH 25.6 (*)    All other components within normal limits  POC URINE PREG, ED  TROPONIN I (HIGH SENSITIVITY)  TROPONIN I (HIGH SENSITIVITY)    ____________________________________________  EKG: Normal sinus rhythm, ventricular rate 87.  PR 140, QRS 86, QTc 452.  No acute ST elevations or depressions.  No acute events of acute ischemia or infarct. ________________________________________  RADIOLOGY All imaging, including plain films, CT scans, and ultrasounds, independently reviewed by me, and  interpretations confirmed via formal radiology reads.  ED MD interpretation:   Chest x-ray: Negative  Official radiology report(s): DG Chest 2 View  Result Date: 09/26/2020 CLINICAL DATA:  Chest pain radiating to the left side of the mandible. EXAM: CHEST - 2 VIEW COMPARISON:  PA and lateral chest 03/16/2020. FINDINGS: Lungs clear. Heart size normal. No pneumothorax or pleural fluid. No acute or focal bony abnormality. IMPRESSION: Negative chest. Electronically Signed   By: Drusilla Kanner M.D.   On: 09/26/2020 12:09    ____________________________________________  PROCEDURES   Procedure(s) performed (including Critical Care):  Procedures  ____________________________________________  INITIAL IMPRESSION / MDM / ASSESSMENT AND PLAN / ED COURSE  As part of my medical decision making, I reviewed the following data within the electronic MEDICAL RECORD NUMBER Nursing notes reviewed and incorporated, Old chart reviewed, Notes from prior ED visits, and [place] Controlled Substance Database       *Kayla Howell was evaluated in Emergency Department on 09/26/2020 for the symptoms described in the history of present illness. She was evaluated in the context of the global COVID-19 pandemic, which necessitated consideration that the patient might be at risk for infection with the SARS-CoV-2 virus that causes COVID-19. Institutional protocols and algorithms that pertain to the evaluation of patients at risk for COVID-19 are in a state of rapid change based on information released by regulatory bodies including the CDC and federal and state organizations. These policies and algorithms were followed during the patient's care in the ED.  Some ED evaluations and interventions may be delayed as a result of limited staffing during the pandemic.*     Medical Decision Making: 40 year old female here with atypical chest pain.  EKG is nonischemic and troponins are negative x2, do not suspect ACS.  No lower extremity  swelling, tachycardia, tachypnea, or signs of ACS or PE.  Lab work is otherwise reassuring.  Abdomen is soft and nontender without signs distress cholecystitis, pancreatitis, referred intra-abdominal pathology.  Patient has a fairly well-documented history of  similar complaints, and I suspect she is having esophageal spasms versus jackhammer esophagus.  This seems to precipitate anxiety which is also contributing.  She was given nitroglycerin here, with significant relief in her symptoms.  This was prescribed by GI but she was hesitant to take this.  She tolerated it well with no hypotension here.  Will advise her to start trying this as needed for chest pain, as well as continue her antidepressants and follow-up with her PCP and GI.  Return precautions given.  ____________________________________________  FINAL CLINICAL IMPRESSION(S) / ED DIAGNOSES  Final diagnoses:  Esophageal spasm     MEDICATIONS GIVEN DURING THIS VISIT:  Medications  nitroGLYCERIN (NITROSTAT) SL tablet 0.4 mg (0.4 mg Sublingual Given 09/26/20 1437)     ED Discharge Orders          Ordered    nitroGLYCERIN (NITROSTAT) 0.3 MG SL tablet  Every 5 min PRN        09/26/20 1508             Note:  This document was prepared using Dragon voice recognition software and may include unintentional dictation errors.   Shaune PollackIsaacs, Khanh Cordner, MD 09/26/20 1536

## 2020-10-01 ENCOUNTER — Other Ambulatory Visit: Payer: Self-pay

## 2020-10-01 ENCOUNTER — Ambulatory Visit
Admission: EM | Admit: 2020-10-01 | Discharge: 2020-10-01 | Disposition: A | Discharge status: Home / Self Care | Payer: Medicaid Other | Attending: Family Medicine | Admitting: Family Medicine

## 2020-10-01 DIAGNOSIS — R300 Dysuria: Secondary | ICD-10-CM | POA: Insufficient documentation

## 2020-10-01 LAB — URINALYSIS, COMPLETE (UACMP) WITH MICROSCOPIC
Glucose, UA: NEGATIVE mg/dL
Hgb urine dipstick: NEGATIVE
Ketones, ur: 15 mg/dL — AB
Leukocytes,Ua: NEGATIVE
Nitrite: NEGATIVE
Protein, ur: 30 mg/dL — AB
Specific Gravity, Urine: >1.03 — ABNORMAL HIGH (ref 1.005–1.030)
pH: 6 (ref 5.0–8.0)

## 2020-10-01 NOTE — ED Provider Notes (Signed)
[facility]    CSN: [id number] Arrival date & time: 10/01/20  1147      History   Chief Complaint Chief Complaint  Patient presents with   Abdominal Pain   HPI  40 year old female presents with urinary symptoms.  Patient reports that her symptoms started yesterday.  She reports urinary urgency and frequency.  She also reports some burning.  Patient reports ongoing back pain which radiates down her left leg.  No fever.  No flank pain.  No nausea or vomiting.  No relieving factors.  She reports some mild lower abdominal pain.  4/10 in severity.  Past Medical History:  Diagnosis Date   Anxiety    GERD (gastroesophageal reflux disease)    Medical history non-contributory     Patient Active Problem List   Diagnosis Date Noted   Dysphagia 04/09/2020   Weight loss 04/09/2020   Anxiety 02/23/2020   Gastroesophageal reflux disease 01/28/2020   Somatic dysfunction of rib 01/28/2020   Pregnancy 09/16/2015   Fe deficiency anemia 05/07/2015   Late prenatal care 05/06/2015   Obesity (BMI 30-39.9) 05/06/2015   Oligohydramnios, delivered 05/06/2015   Postpartum depression 05/06/2015   Tobacco smoking affecting pregnancy in second trimester 05/06/2015    Past Surgical History:  Procedure Laterality Date   ABDOMINAL HYSTERECTOMY     NO PAST SURGERIES      OB History     Gravida  9   Para  7   Term  7   Preterm  0   AB  2   Living  0      SAB  1   IAB  1   Ectopic  0   Multiple  0   Live Births               Home Medications    Prior to Admission medications   Medication Sig Start Date End Date Taking? Authorizing Provider  busPIRone (BUSPAR) 5 MG tablet Take by mouth. 04/16/20  Yes [provider]  citalopram (CELEXA) 20 MG tablet Take 20 mg by mouth daily.   Yes [provider]  famotidine (PEPCID) 20 MG tablet Take 1 tablet (20 mg total) by mouth 2 (two) times daily. 06/03/19  Yes Irean Hong, MD  fluticasone  (FLONASE) 50 MCG/ACT nasal spray Place 2 sprays into both nostrils daily. 08/05/19  Yes Martin, Mary-Margaret, FNP  Iron-FA-B Cmp-C-Biot-Probiotic (FUSION PLUS) CAPS Take 1 capsule by mouth daily. 05/07/15  Yes [provider]  LORazepam (ATIVAN) 1 MG tablet Take 1 mg by mouth daily as needed. 04/07/20  Yes [provider]  nitroGLYCERIN (NITROSTAT) 0.3 MG SL tablet Place 1 tablet (0.3 mg total) under the tongue every 5 (five) minutes as needed (chest pain/esophageal spasm, up to 3 times). 09/26/20  Yes Shaune Pollack, MD  omeprazole (PRILOSEC) 20 MG capsule Take 1 capsule (20 mg total) by mouth in the morning and at bedtime for 28 days. 09/03/20 10/01/20 Yes Melodie Bouillon B, MD  tranexamic acid (LYSTEDA) 650 MG TABS tablet PLEASE SEE ATTACHED FOR DETAILED DIRECTIONS 01/26/19  Yes [provider]  ALPRAZolam (XANAX) 0.5 MG tablet Take 0.5 mg by mouth 3 (three) times daily as needed. 06/22/20   [provider]  sucralfate (CARAFATE) 1 g tablet Take by mouth. 04/16/20 05/16/20  [provider]  venlafaxine XR (EFFEXOR-XR) 37.5 MG 24 hr capsule Take 37.5 mg by mouth daily. 08/09/20   [provider]    Family History Family History  Problem Relation Age of Onset   Healthy Mother    Healthy Father     Social History Social History   Tobacco Use   Smoking status: Every Day    Packs/day: 1.00    Pack years: 0.00    Types: Cigarettes   Smokeless tobacco: Never  Vaping Use   Vaping Use: Never used  Substance Use Topics   Alcohol use: No   Drug use: No     Allergies   Patient has no known allergies.   Review of Systems Review of Systems Per HPI  Physical Exam Triage Vital Signs ED Triage Vitals [10/01/20 1156]  Enc Vitals Group     BP 116/71     Pulse Rate 89     Resp 16     Temp 98.2 F (36.8 C)     Temp Source Oral     SpO2 99 %     Weight 205 lb (93 kg)     Height      Head Circumference      Peak Flow      Pain  Score 4     Pain Loc      Pain Edu?      Excl. in GC?    Updated Vital Signs BP 116/71 (BP Location: Right Arm)   Pulse 89   Temp 98.2 F (36.8 C) (Oral)   Resp 16   Wt 93 kg   LMP 06/02/2019   SpO2 99%   BMI 29.41 kg/m   Visual Acuity Right Eye Distance:   Left Eye Distance:   Bilateral Distance:    Right Eye Near:   Left Eye Near:    Bilateral Near:     Physical Exam Vitals and nursing note reviewed.  Constitutional:      General: She is not in acute distress.    Appearance: She is well-developed. She is not ill-appearing.  HENT:     Head: Normocephalic and atraumatic.  Eyes:     General:        Right eye: No discharge.        Left eye: No discharge.     Conjunctiva/sclera: Conjunctivae normal.  Cardiovascular:     Rate and Rhythm: Normal rate and regular rhythm.  Pulmonary:     Effort: Pulmonary effort is normal.     Breath sounds: Normal breath sounds. No wheezing or rales.  Abdominal:     General: There is no distension.     Palpations: Abdomen is soft.     Comments: Mild tenderness in the suprapubic region.  Neurological:     Mental Status: She is alert.  Psychiatric:        Mood and Affect: Mood normal.        Behavior: Behavior normal.   UC Treatments / Results  Labs (all labs ordered are listed, but only abnormal results are displayed) Labs Reviewed  URINALYSIS, COMPLETE (UACMP) WITH MICROSCOPIC - Abnormal; Notable for the following components:      Result Value   Specific Gravity, Urine >1.030 (*)    Bilirubin Urine SMALL (*)    Ketones, ur 15 (*)    Protein, ur 30 (*)    Bacteria, UA FEW (*)    All other components within normal limits  URINE CULTURE    EKG   Radiology No results found.  Procedures Procedures (including critical care time)  Medications Ordered in UC Medications - No data to display  Initial Impression / Assessment and Plan /  UC Course  I have reviewed the triage vital signs and the nursing notes.  Pertinent  labs & imaging results that were available during my care of the patient were reviewed by me and considered in my medical decision making (see chart for details).    40 year old female presents with the above complaints.  Urinalysis not consistent with UTI.  Her urine is very concentrated.  Advised to push fluids.  Awaiting urine culture.  Final Clinical Impressions(s) / UC Diagnoses   Final diagnoses:  Dysuria     Discharge Instructions      Drink lots of water. At least 64 oz a day.  I have sent your urine for culture.  If it is positive, we will call.  Take care  Dr. Adriana Simas    ED Prescriptions   None    PDMP not reviewed this encounter.   Tommie Sams, [place] 10/01/20 1314

## 2020-10-01 NOTE — Discharge Instructions (Addendum)
Drink lots of water. At least 64 oz a day.  I have sent your urine for culture.  If it is positive, we will call.  Take care  Dr. Adriana Simas

## 2020-10-01 NOTE — ED Triage Notes (Signed)
Pt c/o lower back pain, abdominal pain, urgency, and burning that started yesterday morning. Pt states she also had some pain down her left leg.

## 2020-10-03 LAB — URINE CULTURE: Culture: 60000 — AB

## 2020-12-03 ENCOUNTER — Ambulatory Visit: Payer: Medicaid Other | Admitting: Gastroenterology

## 2020-12-03 ENCOUNTER — Other Ambulatory Visit: Payer: Self-pay

## 2020-12-30 ENCOUNTER — Other Ambulatory Visit: Payer: Self-pay

## 2020-12-30 ENCOUNTER — Ambulatory Visit
Admission: EM | Admit: 2020-12-30 | Discharge: 2020-12-30 | Disposition: A | Discharge status: Home / Self Care | Payer: Medicaid Other | Attending: Family Medicine | Admitting: Family Medicine

## 2020-12-30 ENCOUNTER — Encounter: Payer: Self-pay | Admitting: Emergency Medicine

## 2020-12-30 DIAGNOSIS — Z20822 Contact with and (suspected) exposure to covid-19: Secondary | ICD-10-CM | POA: Insufficient documentation

## 2020-12-30 DIAGNOSIS — H6690 Otitis media, unspecified, unspecified ear: Secondary | ICD-10-CM | POA: Diagnosis not present

## 2020-12-30 LAB — SARS CORONAVIRUS 2 (TAT 6-24 HRS): SARS Coronavirus 2: POSITIVE — AB

## 2020-12-30 MED ORDER — AMOXICILLIN-POT CLAVULANATE 875-125 MG PO TABS: 1.0000 | ORAL_TABLET | Freq: Two times a day (BID) | ORAL | 0 refills | Status: DC

## 2020-12-30 NOTE — ED Provider Notes (Signed)
[facility]    CSN: [id number] Arrival date & time: 12/30/20  0956      History   Chief Complaint Chief Complaint  Patient presents with   Nasal Congestion   Otalgia    HPI  40 year old female presents with the above complaints.  3-day history of symptoms.  She reports nasal congestion, left ear pain.  Also reports runny nose and has also had cough.  She is requesting COVID testing today.  No fever.  No relieving factors.  Pain currently 6/10 in severity.  No other complaints or concerns at this time.  Past Medical History:  Diagnosis Date   Anxiety    GERD (gastroesophageal reflux disease)    Medical history non-contributory     Patient Active Problem List   Diagnosis Date Noted   Dysphagia 04/09/2020   Weight loss 04/09/2020   Anxiety 02/23/2020   Gastroesophageal reflux disease 01/28/2020   Somatic dysfunction of rib 01/28/2020   Pregnancy 09/16/2015   Fe deficiency anemia 05/07/2015   Late prenatal care 05/06/2015   Obesity (BMI 30-39.9) 05/06/2015   Oligohydramnios, delivered 05/06/2015   Postpartum depression 05/06/2015   Tobacco smoking affecting pregnancy in second trimester 05/06/2015    Past Surgical History:  Procedure Laterality Date   ABDOMINAL HYSTERECTOMY     NO PAST SURGERIES      OB History     Gravida  9   Para  7   Term  7   Preterm  0   AB  2   Living  0      SAB  1   IAB  1   Ectopic  0   Multiple  0   Live Births               Home Medications    Prior to Admission medications   Medication Sig Start Date End Date Taking? Authorizing Provider  amoxicillin-clavulanate (AUGMENTIN) 875-125 MG tablet Take 1 tablet by mouth 2 (two) times daily. 12/30/20  Yes Cleora Karnik G, DO  ALPRAZolam (XANAX) 0.5 MG tablet Take 0.5 mg by mouth 3 (three) times daily as needed. 06/22/20   [provider]  busPIRone (BUSPAR) 5 MG tablet Take by mouth. 04/16/20   [provider]  citalopram (CELEXA) 20  MG tablet Take 20 mg by mouth daily.    [provider]  famotidine (PEPCID) 20 MG tablet Take 1 tablet (20 mg total) by mouth 2 (two) times daily. 06/03/19   Irean Hong, MD  fluticasone (FLONASE) 50 MCG/ACT nasal spray Place 2 sprays into both nostrils daily. 08/05/19   Daphine Deutscher, Mary-Margaret, FNP  Iron-FA-B Cmp-C-Biot-Probiotic (FUSION PLUS) CAPS Take 1 capsule by mouth daily. 05/07/15   [provider]  LORazepam (ATIVAN) 1 MG tablet Take 1 mg by mouth daily as needed. 04/07/20   [provider]  nitroGLYCERIN (NITROSTAT) 0.3 MG SL tablet Place 1 tablet (0.3 mg total) under the tongue every 5 (five) minutes as needed (chest pain/esophageal spasm, up to 3 times). 09/26/20   Shaune Pollack, MD  omeprazole (PRILOSEC) 20 MG capsule Take 1 capsule (20 mg total) by mouth in the morning and at bedtime for 28 days. 09/03/20 10/01/20  Pasty Spillers, MD  sucralfate (CARAFATE) 1 g tablet Take by mouth. 04/16/20 05/16/20  [provider]  tranexamic acid (LYSTEDA) 650 MG TABS tablet PLEASE SEE ATTACHED FOR DETAILED DIRECTIONS 01/26/19   [provider]  venlafaxine XR (EFFEXOR-XR) 37.5 MG 24 hr capsule Take 37.5  mg by mouth daily. 08/09/20   [provider]    Family History Family History  Problem Relation Age of Onset   Healthy Mother    Healthy Father     Social History Social History   Tobacco Use   Smoking status: Every Day    Packs/day: 1.00    Types: Cigarettes   Smokeless tobacco: Never  Vaping Use   Vaping Use: Never used  Substance Use Topics   Alcohol use: No   Drug use: No     Allergies   Patient has no known allergies.   Review of Systems Review of Systems Per HPI  Physical Exam Triage Vital Signs ED Triage Vitals  Enc Vitals Group     BP 12/30/20 1101 (!) 145/89     Pulse Rate 12/30/20 1101 80     Resp 12/30/20 1101 18     Temp 12/30/20 1101 98.6 F (37 C)     Temp Source 12/30/20 1101 Oral     SpO2 12/30/20  1101 96 %     Weight --      Height --      Head Circumference --      Peak Flow --      Pain Score 12/30/20 1057 6     Pain Loc --      Pain Edu? --      Excl. in GC? --    No data found.  Updated Vital Signs BP (!) 145/89 (BP Location: Right Arm)   Pulse 80   Temp 98.6 F (37 C) (Oral)   Resp 18   LMP 06/02/2019   SpO2 96%   Visual Acuity Right Eye Distance:   Left Eye Distance:   Bilateral Distance:    Right Eye Near:   Left Eye Near:    Bilateral Near:     Physical Exam Vitals and nursing note reviewed.  Constitutional:      General: She is not in acute distress.    Appearance: Normal appearance. She is not ill-appearing.  HENT:     Head: Normocephalic and atraumatic.     Right Ear: Tympanic membrane normal.     Ears:     Comments: Left TM with severe erythema. Eyes:     General:        Right eye: No discharge.        Left eye: No discharge.     Conjunctiva/sclera: Conjunctivae normal.  Cardiovascular:     Rate and Rhythm: Normal rate and regular rhythm.  Pulmonary:     Effort: Pulmonary effort is normal.     Breath sounds: No wheezing or rales.  Neurological:     Mental Status: She is alert.  Psychiatric:        Mood and Affect: Mood normal.        Behavior: Behavior normal.     UC Treatments / Results  Labs (all labs ordered are listed, but only abnormal results are displayed) Labs Reviewed  SARS CORONAVIRUS 2 (TAT 6-24 HRS)    EKG   Radiology No results found.  Procedures Procedures (including critical care time)  Medications Ordered in UC Medications - No data to display  Initial Impression / Assessment and Plan / UC Course  I have reviewed the triage vital signs and the nursing notes.  Pertinent labs & imaging results that were available during my care of the patient were reviewed by me and considered in my medical decision making (see chart for details).  40 year old female presents with acute otitis media.  Awaiting COVID  test results.  Augmentin as prescribed.  Final Clinical Impressions(s) / UC Diagnoses   Final diagnoses:  Acute otitis media, unspecified otitis media type  Close exposure to COVID-19 virus     Discharge Instructions      Antibiotic as prescribed.  Check Mychart for COVID test results.  Take care  Dr. Adriana Simas    ED Prescriptions     Medication Sig Dispense Auth. Provider   amoxicillin-clavulanate (AUGMENTIN) 875-125 MG tablet Take 1 tablet by mouth 2 (two) times daily. 20 tablet Tommie Sams, DO      PDMP not reviewed this encounter.   Tommie Sams, [place] 12/30/20 1624

## 2020-12-30 NOTE — ED Triage Notes (Signed)
Pt presents today with c/o of nasal congestion and left ear pain x 3 days. She requests Covid test. Denies fever.

## 2020-12-30 NOTE — Discharge Instructions (Signed)
Antibiotic as prescribed.  Check Mychart for COVID test results.  Take care  Dr. Adriana Simas

## 2021-01-19 ENCOUNTER — Other Ambulatory Visit: Payer: Self-pay | Admitting: Gastroenterology

## 2021-04-02 IMAGING — CR DG CHEST 2V
2 series · 2 of 2 positions shown · non-contrast
Comparison: None.

CLINICAL DATA: Chest pain, shortness of breath

EXAM:
CHEST - 2 VIEW

[chest pa]
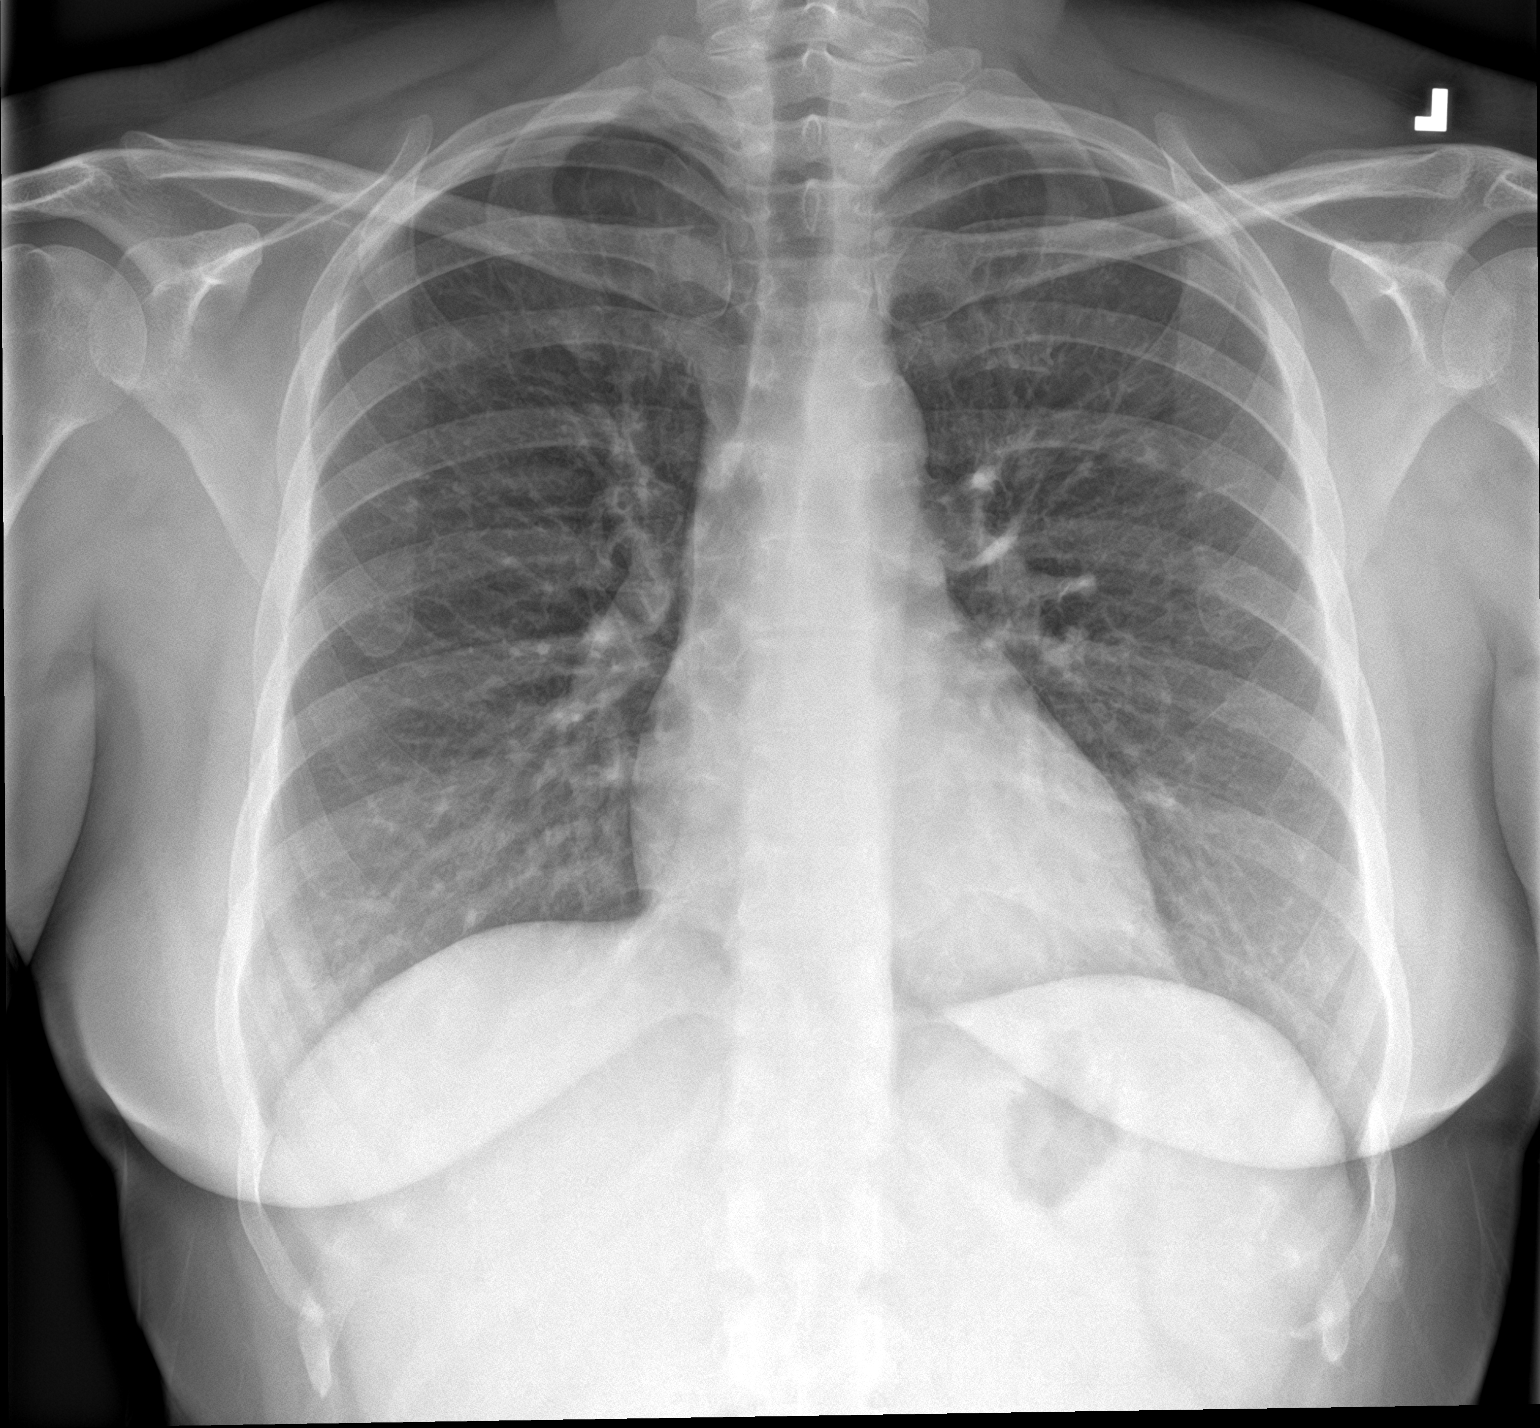

[chest lat]
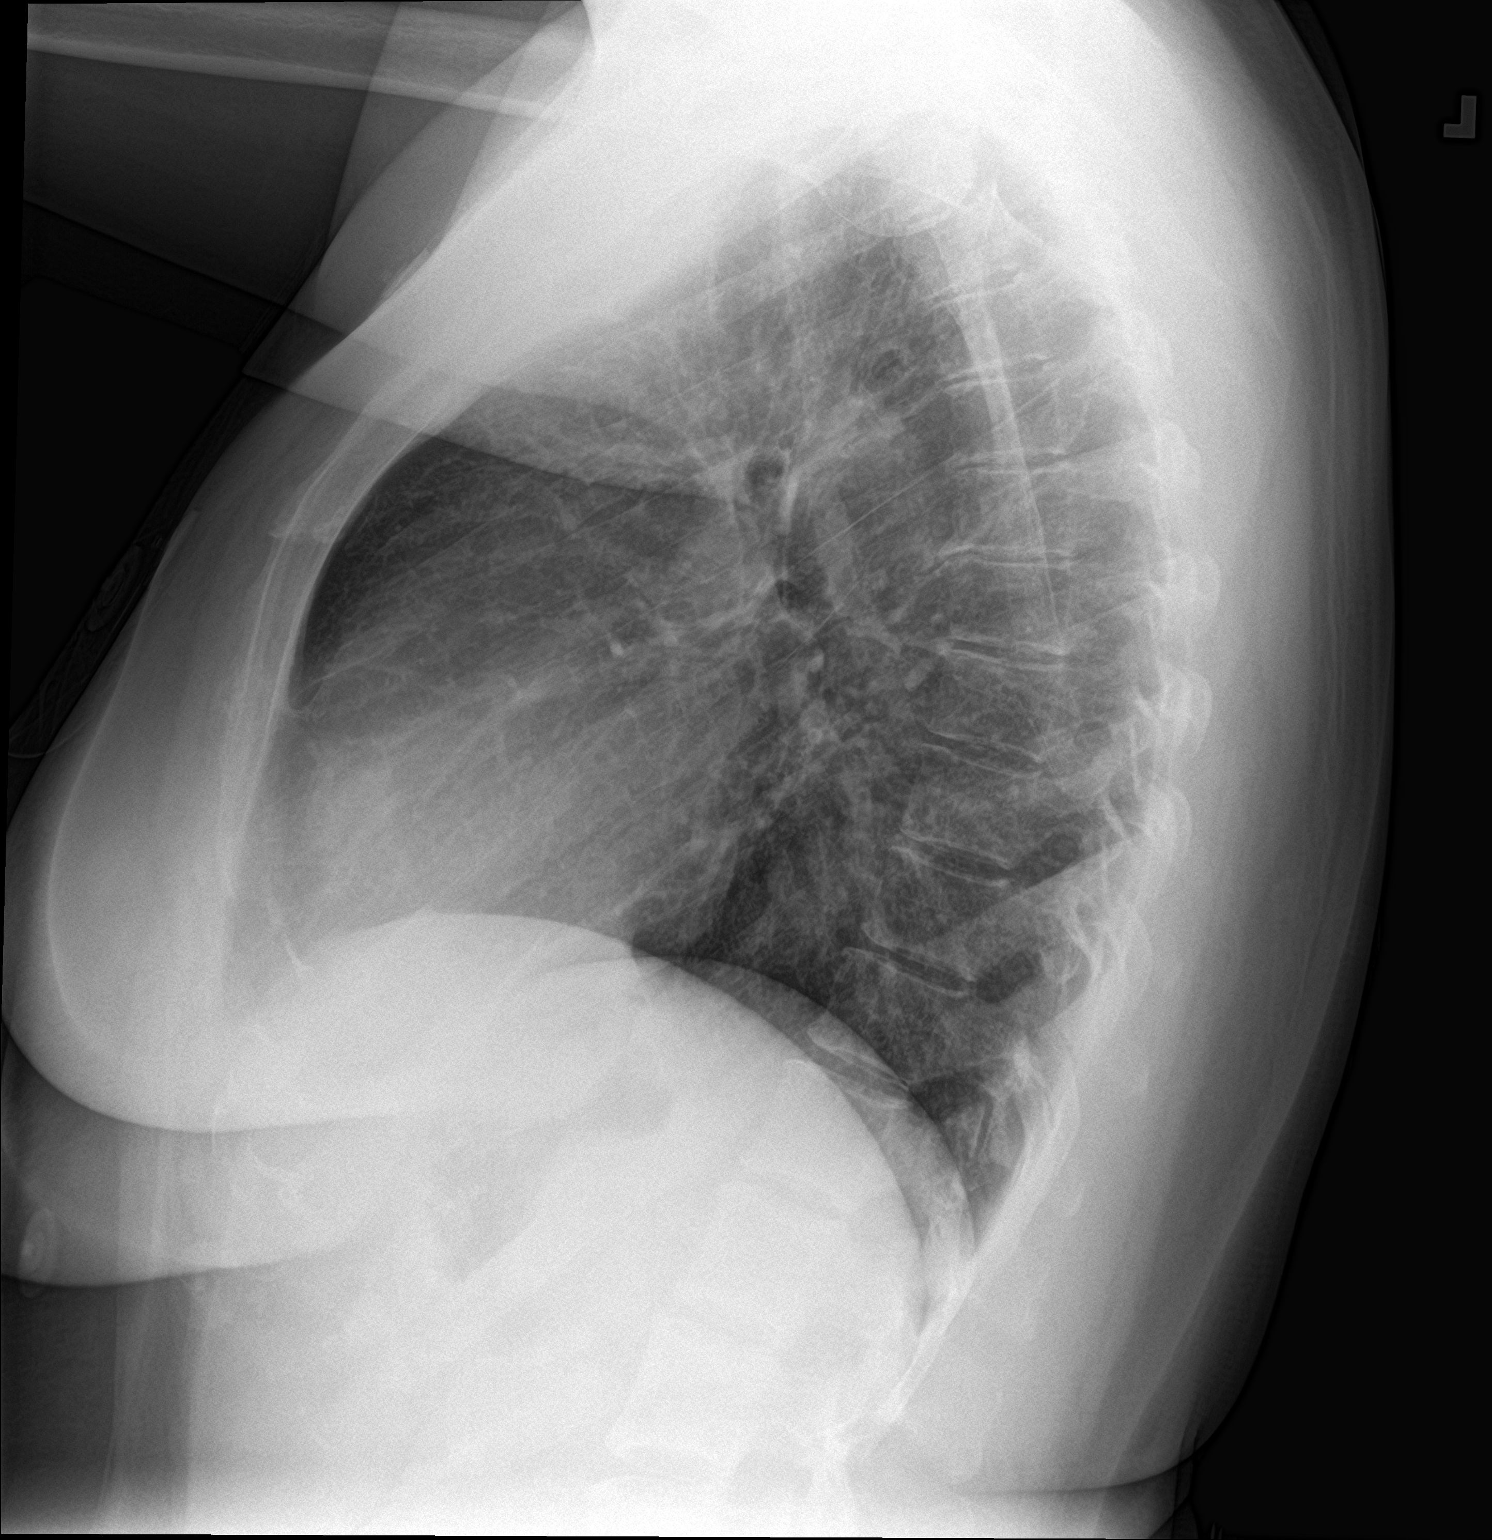

[2 of 2 positions shown; findings below may reference images not displayed]

FINDINGS: The heart size and mediastinal contours are within normal limits.
Both lungs are clear. The visualized skeletal structures are
unremarkable.
IMPRESSION: No active cardiopulmonary disease.

## 2021-04-07 ENCOUNTER — Other Ambulatory Visit: Payer: Self-pay

## 2021-04-07 ENCOUNTER — Ambulatory Visit
Admission: EM | Admit: 2021-04-07 | Discharge: 2021-04-07 | Disposition: A | Discharge status: Home / Self Care | Payer: Medicaid Other

## 2021-04-07 DIAGNOSIS — J069 Acute upper respiratory infection, unspecified: Secondary | ICD-10-CM | POA: Diagnosis not present

## 2021-04-07 MED ORDER — AMOXICILLIN-POT CLAVULANATE 875-125 MG PO TABS: 1.0000 | ORAL_TABLET | Freq: Two times a day (BID) | ORAL | 0 refills | Status: AC

## 2021-04-07 MED ORDER — PROMETHAZINE-PHENYLEPHRINE 6.25-5 MG/5ML PO SYRP: 5.0000 mL | ORAL_SOLUTION | Freq: Four times a day (QID) | ORAL | 0 refills | Status: DC | PRN

## 2021-04-07 MED ORDER — BENZONATATE 100 MG PO CAPS: 200.0000 mg | ORAL_CAPSULE | Freq: Three times a day (TID) | ORAL | 0 refills | Status: DC

## 2021-04-07 MED ORDER — IPRATROPIUM BROMIDE 0.06 % NA SOLN: 2.0000 | Freq: Four times a day (QID) | NASAL | 12 refills | Status: DC

## 2021-04-07 NOTE — Discharge Instructions (Signed)
The Augmentin twice daily with food for 10 days for treatment of your URI.  Perform sinus irrigation 2-3 times a day with a [employer] sinus rinse kit and distilled water.  Do not use tap water.  You can use plain over-the-counter Mucinex every 6 hours to break up the stickiness of the mucus so your body can clear it.  Increase your oral fluid intake to thin out your mucus so that is also able for your body to clear more easily.  Take an over-the-counter probiotic, such as Culturelle-align-activia, 1 hour after each dose of antibiotic to prevent diarrhea.  Use the Atrovent nasal spray, 2 squirts in each nostril every 6 hours, as needed for runny nose and postnasal drip.  Use the Tessalon Perles every 8 hours during the day.  Take them with a small sip of water.  They may give you some numbness to the base of your tongue or a metallic taste in your mouth, this is normal.  Use the Promethazine VC cough syrup at bedtime for cough and congestion.  It will make you drowsy so do not take it during the day.  Return for reevaluation or see your primary care provider for any new or worsening symptoms.   If you develop any new or worsening symptoms return for reevaluation or see your primary care provider.

## 2021-04-07 NOTE — ED Provider Notes (Signed)
[facility]    CSN: [id number] Arrival date & time: 04/07/21  1400      History   Chief Complaint Chief Complaint  Patient presents with   Nasal Congestion    Family of 3    HPI Kayla Howell is a 40 y.o. female.   HPI  40 year old female here for evaluation of respiratory complaints.  Patient reports that she has been experiencing a frontal sinus headache, nasal congestion, sinus pressure, left ear pain, sore throat, and nonproductive cough for last 4 days.  She states that she has been using sinus irrigation with very limited results.  She reports that when she does blood of her nose is thick and green in color.  She denies any fever, shortness breath or wheezing, body aches, or GI complaints.  Past Medical History:  Diagnosis Date   Anxiety    GERD (gastroesophageal reflux disease)    Medical history non-contributory     Patient Active Problem List   Diagnosis Date Noted   Dysphagia 04/09/2020   Weight loss 04/09/2020   Anxiety 02/23/2020   Gastroesophageal reflux disease 01/28/2020   Somatic dysfunction of rib 01/28/2020   Pregnancy 09/16/2015   Fe deficiency anemia 05/07/2015   Late prenatal care 05/06/2015   Obesity (BMI 30-39.9) 05/06/2015   Oligohydramnios, delivered 05/06/2015   Postpartum depression 05/06/2015   Tobacco smoking affecting pregnancy in second trimester 05/06/2015    Past Surgical History:  Procedure Laterality Date   ABDOMINAL HYSTERECTOMY     NO PAST SURGERIES      OB History     Gravida  9   Para  7   Term  7   Preterm  0   AB  2   Living  0      SAB  1   IAB  1   Ectopic  0   Multiple  0   Live Births               Home Medications    Prior to Admission medications   Medication Sig Start Date End Date Taking? Authorizing Provider  amoxicillin-clavulanate (AUGMENTIN) 875-125 MG tablet Take 1 tablet by mouth every 12 (twelve) hours for 10 days. 04/07/21 04/17/21 Yes Margarette Canada, NP   benzonatate (TESSALON) 100 MG capsule Take 2 capsules (200 mg total) by mouth every 8 (eight) hours. 04/07/21  Yes Margarette Canada, NP  citalopram (CELEXA) 20 MG tablet Take 20 mg by mouth daily.   Yes [provider]  fluticasone (FLONASE) 50 MCG/ACT nasal spray Place 2 sprays into both nostrils daily. 08/05/19  Yes Martin, Mary-Margaret, FNP  ipratropium (ATROVENT) 0.06 % nasal spray Place 2 sprays into both nostrils 4 (four) times daily. 04/07/21  Yes Margarette Canada, NP  Iron-FA-B Cmp-C-Biot-Probiotic (FUSION PLUS) CAPS Take 1 capsule by mouth daily. 05/07/15  Yes [provider]  nitroGLYCERIN (NITROSTAT) 0.3 MG SL tablet Place 1 tablet (0.3 mg total) under the tongue every 5 (five) minutes as needed (chest pain/esophageal spasm, up to 3 times). 09/26/20  Yes Duffy Bruce, MD  pantoprazole (PROTONIX) 40 MG tablet TAKE 1 TABLET (40 MG TOTAL) BY MOUTH TWO (2) TIMES A DAY. 03/19/21  Yes [provider]  promethazine-phenylephrine (PROMETHAZINE VC) 6.25-5 MG/5ML SYRP Take 5 mLs by mouth every 6 (six) hours as needed for congestion. 04/07/21  Yes Margarette Canada, NP  ALPRAZolam Duanne Moron) 0.5 MG tablet Take 0.5 mg by mouth 3 (three) times daily as needed. 06/22/20   [provider]  busPIRone (BUSPAR) 5 MG tablet Take by mouth. 04/16/20   [provider]  LORazepam (ATIVAN) 1 MG tablet Take 1 mg by mouth daily as needed. 04/07/20   [provider]  omeprazole (PRILOSEC) 20 MG capsule Take 1 capsule (20 mg total) by mouth in the morning and at bedtime for 28 days. 09/03/20 10/01/20  Virgel Manifold, MD  sucralfate (CARAFATE) 1 g tablet Take by mouth. 04/16/20 05/16/20  [provider]  tranexamic acid (LYSTEDA) 650 MG TABS tablet PLEASE SEE ATTACHED FOR DETAILED DIRECTIONS 01/26/19   [provider]  venlafaxine XR (EFFEXOR-XR) 37.5 MG 24 hr capsule Take 37.5 mg by mouth daily. 08/09/20   [provider]    Family History Family  History  Problem Relation Age of Onset   Healthy Mother    Healthy Father     Social History Social History   Tobacco Use   Smoking status: Every Day    Packs/day: 1.00    Types: Cigarettes   Smokeless tobacco: Never  Vaping Use   Vaping Use: Never used  Substance Use Topics   Alcohol use: No   Drug use: No     Allergies   Patient has no known allergies.   Review of Systems Review of Systems  Constitutional:  Negative for activity change, appetite change and fever.  HENT:  Positive for congestion, ear pain, sinus pressure, sinus pain and sore throat.   Respiratory:  Positive for cough. Negative for shortness of breath and wheezing.   Gastrointestinal:  Negative for diarrhea, nausea and vomiting.  Musculoskeletal:  Negative for arthralgias and myalgias.  Skin:  Negative for rash.  Neurological:  Positive for headaches.  Hematological: Negative.   Psychiatric/Behavioral: Negative.      Physical Exam Triage Vital Signs ED Triage Vitals  Enc Vitals Group     BP 04/07/21 1419 (!) 156/101     Pulse Rate 04/07/21 1419 88     Resp 04/07/21 1419 18     Temp 04/07/21 1419 98.2 F (36.8 C)     Temp Source 04/07/21 1419 Oral     SpO2 04/07/21 1419 100 %     Weight 04/07/21 1415 202 lb (91.6 kg)     Height --      Head Circumference --      Peak Flow --      Pain Score 04/07/21 1414 5     Pain Loc --      Pain Edu? --      Excl. in [place]? --    No data found.  Updated Vital Signs BP (!) 156/101 (BP Location: Left Arm)    Pulse 88    Temp 98.2 F (36.8 C) (Oral)    Resp 18    Wt 202 lb (91.6 kg)    LMP 06/02/2019    SpO2 100%    BMI 28.98 kg/m   Visual Acuity Right Eye Distance:   Left Eye Distance:   Bilateral Distance:    Right Eye Near:   Left Eye Near:    Bilateral Near:     Physical Exam Vitals and nursing note reviewed.  Constitutional:      General: She is not in acute distress.    Appearance: Normal appearance. She is normal weight. She is not  ill-appearing.  HENT:     Head: Normocephalic and atraumatic.     Right Ear: Tympanic membrane, ear canal and external ear normal. There is no impacted cerumen.  Left Ear: Tympanic membrane, ear canal and external ear normal. There is no impacted cerumen.     Nose: Congestion present. No rhinorrhea.     Mouth/Throat:     Mouth: Mucous membranes are moist.     Pharynx: Oropharynx is clear. Posterior oropharyngeal erythema present.  Cardiovascular:     Rate and Rhythm: Normal rate and regular rhythm.     Pulses: Normal pulses.     Heart sounds: Normal heart sounds. No murmur heard.   No gallop.  Pulmonary:     Effort: Pulmonary effort is normal.     Breath sounds: Normal breath sounds. No wheezing, rhonchi or rales.  Musculoskeletal:     Cervical back: Normal range of motion and neck supple.  Lymphadenopathy:     Cervical: No cervical adenopathy.  Skin:    General: Skin is warm and dry.     Capillary Refill: Capillary refill takes less than 2 seconds.     Findings: No erythema or rash.  Neurological:     General: No focal deficit present.     Mental Status: She is alert and oriented to person, place, and time.  Psychiatric:        Mood and Affect: Mood normal.        Behavior: Behavior normal.        Thought Content: Thought content normal.        Judgment: Judgment normal.     UC Treatments / Results  Labs (all labs ordered are listed, but only abnormal results are displayed) Labs Reviewed - No data to display  EKG   Radiology No results found.  Procedures Procedures (including critical care time)  Medications Ordered in UC Medications - No data to display  Initial Impression / Assessment and Plan / UC Course  I have reviewed the triage vital signs and the nursing notes.  Pertinent labs & imaging results that were available during my care of the patient were reviewed by me and considered in my medical decision making (see chart for details).  Patient is a  nontoxic-appearing 73-year-old female here for evaluation of respiratory complaints as outlined in the HPI above.  On physical exam patient has pearly gray tympanic membranes bilaterally with normal light reflex and clear external auditory canals.  Nasal mucosa is markedly edematous and erythematous.  I am unable to visualize the turbinates beyond.  Oropharyngeal exam reveals posterior oropharyngeal erythema and green postnasal drip.  No cervical lymphadenopathy appreciated exam.  Cardiopulmonary exam reveals clear lung sounds in all fields.  I encouraged the patient to continue to perform sinus irrigation to try and clear the mucus from her nasal passages and sinus cavities which is causing her the pain.  We will give Atrovent nasal spray to help with the nasal congestion, Tessalon Perles and Promethazine VC cough syrup to help with cough and congestion.  We will also do a trial of Augmentin given the severity of the inflammation and the purulence of the postnasal drip despite the short length of time that patient has been symptomatic.  Work note provided.   Final Clinical Impressions(s) / UC Diagnoses   Final diagnoses:  Upper respiratory tract infection, unspecified type     Discharge Instructions      The Augmentin twice daily with food for 10 days for treatment of your URI.  Perform sinus irrigation 2-3 times a day with a [employer] sinus rinse kit and distilled water.  Do not use tap water.  You can use plain over-the-counter Mucinex  every 6 hours to break up the stickiness of the mucus so your body can clear it.  Increase your oral fluid intake to thin out your mucus so that is also able for your body to clear more easily.  Take an over-the-counter probiotic, such as Culturelle-align-activia, 1 hour after each dose of antibiotic to prevent diarrhea.  Use the Atrovent nasal spray, 2 squirts in each nostril every 6 hours, as needed for runny nose and postnasal drip.  Use the Tessalon Perles  every 8 hours during the day.  Take them with a small sip of water.  They may give you some numbness to the base of your tongue or a metallic taste in your mouth, this is normal.  Use the Promethazine VC cough syrup at bedtime for cough and congestion.  It will make you drowsy so do not take it during the day.  Return for reevaluation or see your primary care provider for any new or worsening symptoms.   If you develop any new or worsening symptoms return for reevaluation or see your primary care provider.      ED Prescriptions     Medication Sig Dispense Auth. Provider   ipratropium (ATROVENT) 0.06 % nasal spray Place 2 sprays into both nostrils 4 (four) times daily. 15 mL Margarette Canada, NP   benzonatate (TESSALON) 100 MG capsule Take 2 capsules (200 mg total) by mouth every 8 (eight) hours. 21 capsule Margarette Canada, NP   promethazine-phenylephrine (PROMETHAZINE VC) 6.25-5 MG/5ML SYRP Take 5 mLs by mouth every 6 (six) hours as needed for congestion. 180 mL Margarette Canada, NP   amoxicillin-clavulanate (AUGMENTIN) 875-125 MG tablet Take 1 tablet by mouth every 12 (twelve) hours for 10 days. 20 tablet Margarette Canada, NP      PDMP not reviewed this encounter.   Margarette Canada, NP 04/07/21 [id number]

## 2021-04-07 NOTE — ED Triage Notes (Signed)
Patient is here for "Nasal Congestion". Started 04-03-2021 with ha, sinus pressure, otc not helping, no cough, no sob, s/t at times" no fever known. No @ home COVID19 testing. No exposure known. No covid19 vaccines. Flu vaccine done.

## 2021-09-14 ENCOUNTER — Ambulatory Visit
Admission: EM | Admit: 2021-09-14 | Discharge: 2021-09-14 | Disposition: A | Discharge status: Home / Self Care | Payer: Medicaid Other | Attending: Emergency Medicine | Admitting: Emergency Medicine

## 2021-09-14 ENCOUNTER — Encounter: Payer: Self-pay | Admitting: Emergency Medicine

## 2021-09-14 ENCOUNTER — Other Ambulatory Visit: Payer: Self-pay

## 2021-09-14 DIAGNOSIS — H18821 Corneal disorder due to contact lens, right eye: Secondary | ICD-10-CM | POA: Diagnosis not present

## 2021-09-14 DIAGNOSIS — J069 Acute upper respiratory infection, unspecified: Secondary | ICD-10-CM

## 2021-09-14 MED ORDER — IPRATROPIUM BROMIDE 0.06 % NA SOLN: 2.0000 | Freq: Four times a day (QID) | NASAL | 12 refills | Status: DC

## 2021-09-14 MED ORDER — FLUORESCEIN SODIUM 1 MG OP STRP
1.0000 | ORAL_STRIP | Freq: Once | OPHTHALMIC | Status: AC
  Administered 2021-09-14: 1 via OPHTHALMIC

## 2021-09-14 MED ORDER — TETRACAINE HCL 0.5 % OP SOLN
2.0000 [drp] | Freq: Once | OPHTHALMIC | Status: AC
  Administered 2021-09-14: 2 [drp] via OPHTHALMIC

## 2021-09-14 MED ORDER — TOBRAMYCIN-DEXAMETHASONE 0.3-0.1 % OP SUSP: 2.0000 [drp] | Freq: Four times a day (QID) | OPHTHALMIC | 0 refills | Status: DC

## 2021-09-14 MED ORDER — AMOXICILLIN-POT CLAVULANATE 875-125 MG PO TABS: 1.0000 | ORAL_TABLET | Freq: Two times a day (BID) | ORAL | 0 refills | Status: AC

## 2021-09-14 MED ORDER — PROMETHAZINE-DM 6.25-15 MG/5ML PO SYRP: 5.0000 mL | ORAL_SOLUTION | Freq: Four times a day (QID) | ORAL | 0 refills | Status: DC | PRN

## 2021-09-14 MED ORDER — BENZONATATE 100 MG PO CAPS: 200.0000 mg | ORAL_CAPSULE | Freq: Three times a day (TID) | ORAL | 0 refills | Status: DC

## 2021-09-14 NOTE — ED Triage Notes (Signed)
[place].  Patient states that it feels like something is in her right eye.  Patient c/o drainage and redness in her right eye this morning.  Patient also states that he kids have recently had pink eye.

## 2021-09-14 NOTE — ED Provider Notes (Signed)
[facility]    CSN: [id number] Arrival date & time: 09/14/21  1105      History   Chief Complaint Chief Complaint  Patient presents with   Eye Drainage    right    HPI Kayla Howell is a [age] y.o. female.   HPI  41 year old female here for evaluation of multiple complaints.  Patient's primary complaint is that she has been experiencing irritation and a sensation of a foreign body in her right eye since yesterday.  This morning she woke up her eye was matted shut with a thick mucousy discharge and she noticed that her eye was bright red.  Both of her children recently were treated for pinkeye.  [place] also [place] but she denies putting her face in the water.  A secondary complaint is that she has been experiencing a cough with runny nose and nasal congestion for the past 3 weeks.  She denies fever.  Her nasal discharge is clear.  Her cough is also nonproductive.  She is a smoker  Past Medical History:  Diagnosis Date   Anxiety    GERD (gastroesophageal reflux disease)    Medical history non-contributory     Patient Active Problem List   Diagnosis Date Noted   Dysphagia 04/09/2020   Weight loss 04/09/2020   Anxiety 02/23/2020   Gastroesophageal reflux disease 01/28/2020   Somatic dysfunction of rib 01/28/2020   Pregnancy 09/16/2015   Fe deficiency anemia 05/07/2015   Late prenatal care 05/06/2015   Obesity (BMI 30-39.9) 05/06/2015   Oligohydramnios, delivered 05/06/2015   Postpartum depression 05/06/2015   Tobacco smoking affecting pregnancy in second trimester 05/06/2015    Past Surgical History:  Procedure Laterality Date   ABDOMINAL HYSTERECTOMY     NO PAST SURGERIES      OB History     Gravida  9   Para  7   Term  7   Preterm  0   AB  2   Living  0      SAB  1   IAB  1   Ectopic  0   Multiple  0   Live Births               Home Medications    Prior to Admission medications   Medication  Sig Start Date End Date Taking? Authorizing Provider  ALPRAZolam Prudy Feeler(XANAX) 0.5 MG tablet Take 0.5 mg by mouth 3 (three) times daily as needed. 06/22/20  Yes [provider]  amoxicillin-clavulanate (AUGMENTIN) 875-125 MG tablet Take 1 tablet by mouth every 12 (twelve) hours for 10 days. 09/14/21 09/24/21 Yes Becky Augustayan, Berlinda Farve, NP  benzonatate (TESSALON) 100 MG capsule Take 2 capsules (200 mg total) by mouth every 8 (eight) hours. 09/14/21  Yes Becky Augustayan, Ayla Dunigan, NP  ipratropium (ATROVENT) 0.06 % nasal spray Place 2 sprays into both nostrils 4 (four) times daily. 09/14/21  Yes Becky Augustayan, Robi Dewolfe, NP  promethazine-dextromethorphan (PROMETHAZINE-DM) 6.25-15 MG/5ML syrup Take 5 mLs by mouth 4 (four) times daily as needed. 09/14/21  Yes Becky Augustayan, Pihu Basil, NP  tobramycin-dexamethasone [facility](TOBRADEX) ophthalmic solution Place 2 drops into the right eye every 6 (six) hours. 09/14/21  Yes Becky Augustayan, Janis Sol, NP  venlafaxine XR (EFFEXOR-XR) 37.5 MG 24 hr capsule Take 37.5 mg by mouth daily. 08/09/20  Yes [provider]  busPIRone (BUSPAR) 5 MG tablet Take by mouth. 04/16/20   [provider]  fluticasone (FLONASE) 50 MCG/ACT nasal spray Place 2 sprays into both nostrils daily. 08/05/19  Daphine Deutscher, Mary-Margaret, FNP  Iron-FA-B Cmp-C-Biot-Probiotic (FUSION PLUS) CAPS Take 1 capsule by mouth daily. 05/07/15   [provider]  LORazepam (ATIVAN) 1 MG tablet Take 1 mg by mouth daily as needed. 04/07/20   [provider]  nitroGLYCERIN (NITROSTAT) 0.3 MG SL tablet Place 1 tablet (0.3 mg total) under the tongue every 5 (five) minutes as needed (chest pain/esophageal spasm, up to 3 times). 09/26/20   Shaune Pollack, MD  promethazine-phenylephrine (PROMETHAZINE VC) 6.25-5 MG/5ML SYRP Take 5 mLs by mouth every 6 (six) hours as needed for congestion. 04/07/21   Becky Augusta, NP  sucralfate (CARAFATE) 1 g tablet Take by mouth. 04/16/20 05/16/20  [provider]  tranexamic acid (LYSTEDA) 650 MG TABS tablet PLEASE SEE  ATTACHED FOR DETAILED DIRECTIONS 01/26/19   [provider]    Family History Family History  Problem Relation Age of Onset   Healthy Mother    Healthy Father     Social History Social History   Tobacco Use   Smoking status: Every Day    Packs/day: 1.00    Types: Cigarettes   Smokeless tobacco: Never  Vaping Use   Vaping Use: Never used  Substance Use Topics   Alcohol use: No   Drug use: No     Allergies   Patient has no known allergies.   Review of Systems Review of Systems  Constitutional:  Negative for fever.  HENT:  Positive for congestion and rhinorrhea. Negative for sore throat.   Eyes:  Positive for discharge and redness. Negative for photophobia, pain, itching and visual disturbance.  Respiratory:  Positive for cough. Negative for shortness of breath and wheezing.   Hematological: Negative.   Psychiatric/Behavioral: Negative.      Physical Exam Triage Vital Signs ED Triage Vitals  Enc Vitals Group     BP 09/14/21 1145 (!) 141/90     Pulse Rate 09/14/21 1145 88     Resp 09/14/21 1145 14     Temp 09/14/21 1145 97.8 F (36.6 C)     Temp Source 09/14/21 1145 Oral     SpO2 09/14/21 1145 99 %     Weight 09/14/21 1142 223 lb (101.2 kg)     Height 09/14/21 1142 5\' 10"  (1.778 m)     Head Circumference --      Peak Flow --      Pain Score 09/14/21 1142 5     Pain Loc --      Pain Edu? --      Excl. in [place]? --    No data found.  Updated Vital Signs BP (!) 141/90 (BP Location: Left Arm)   Pulse 88   Temp 97.8 F (36.6 C) (Oral)   Resp 14   Ht 5\' 10"  (1.778 m)   Wt 223 lb (101.2 kg)   LMP 06/02/2019   SpO2 99%   Breastfeeding No   BMI 32.00 kg/m   Visual Acuity Right Eye Distance: 20/40 uncorrected Left Eye Distance: 20/30 uncorrected Bilateral Distance: 20/30 uncorrected  Right Eye Near:   Left Eye Near:    Bilateral Near:     Physical Exam Vitals and nursing note reviewed.  Constitutional:      Appearance: Normal appearance.  She is not ill-appearing.  HENT:     Head: Normocephalic and atraumatic.     Right Ear: Tympanic membrane, ear canal and external ear normal. There is no impacted cerumen.     Left Ear: Tympanic membrane, ear canal and external ear normal.  There is no impacted cerumen.     Nose: Congestion and rhinorrhea present.     Mouth/Throat:     Mouth: Mucous membranes are moist.     Pharynx: Oropharynx is clear. Posterior oropharyngeal erythema present. No oropharyngeal exudate.  Eyes:     General: No scleral icterus.       Right eye: Discharge present.        Left eye: No discharge.     Extraocular Movements: Extraocular movements intact.     Pupils: Pupils are equal, round, and reactive to light.  Cardiovascular:     Rate and Rhythm: Normal rate and regular rhythm.     Pulses: Normal pulses.     Heart sounds: Normal heart sounds. No murmur heard.   No friction rub. No gallop.  Pulmonary:     Effort: Pulmonary effort is normal.     Breath sounds: Normal breath sounds. No wheezing, rhonchi or rales.  Musculoskeletal:     Cervical back: Normal range of motion and neck supple.  Lymphadenopathy:     Cervical: No cervical adenopathy.  Skin:    General: Skin is warm and dry.     Capillary Refill: Capillary refill takes less than 2 seconds.     Findings: No erythema or rash.  Neurological:     General: No focal deficit present.     Mental Status: She is alert and oriented to person, place, and time.  Psychiatric:        Mood and Affect: Mood normal.        Behavior: Behavior normal.        Thought Content: Thought content normal.        Judgment: Judgment normal.     UC Treatments / Results  Labs (all labs ordered are listed, but only abnormal results are displayed) Labs Reviewed - No data to display  EKG   Radiology No results found.  Procedures Procedures (including critical care time)  Medications Ordered in UC Medications  tetracaine (PONTOCAINE) 0.5 % ophthalmic  solution 2 drop (has no administration in time range)  fluorescein ophthalmic strip 1 strip (has no administration in time range)    Initial Impression / Assessment and Plan / UC Course  I have reviewed the triage vital signs and the nursing notes.  Pertinent labs & imaging results that were available during my care of the patient were reviewed by me and considered in my medical decision making (see chart for details).  Patient is a very pleasant, nontoxic-appearing 41 year old female here for evaluation of both respiratory complaints and ocular complaints as outlined in HPI above.  Her physical exam reveals pearly-gray tympanic membranes bilaterally with normal light reflex and clear external auditory canals.  Her nasal mucosa is erythematous and edematous with clear discharge in both nares.  Oropharyngeal exam reveals posterior oropharyngeal erythema with clear postnasal drip.  No cervical lymphadenopathy appreciable exam.  Cardiopulmonary exam reveals S1-S2 heart sounds with regular rate and rhythm and lung sounds that are clear to auscultation all fields.  Ocular exam reveals pupils are equal and reactive bilaterally and her EOM is intact.  The labral and bulbar conjunctiva of the right eye is markedly erythematous and injected.  The left eye has mild erythema to bulbar and labral conjunctiva.  I instilled 2 drops of tetracaine in each eye and stained them individually with fluorescein dye.  Both eyes were examined under [place] lamp.  No foreign bodies noted in either eye.  There is a linear corneal abrasion  extending from the 6:00 to the 7 o'clock position of the iris in the right eye.  I will treat the patient for corneal abrasion with TobraDex twice daily for 5 days.  The rest the patient's exam is consistent with an upper respiratory infection.  Given that she has had her symptoms for 3 weeks, and she is a smoker, I will do a trial of Augmentin twice daily for 10 days.  I will also prescribe  Atrovent nasal spray, Tessalon Perles, and Promethazine DM cough syrup to help with symptoms.   Final Clinical Impressions(s) / UC Diagnoses   Final diagnoses:  Acute upper respiratory infection  Corneal abrasion due to contact lens, right     Discharge Instructions      Instill 2 drops of TobraDex in your right eye 4 times a day for 5 days.  Abstain from wearing contacts until your symptoms have completely resolved.  When you do resume wearing contacts start with a fresh pair.  Make sure that you are storing your contracts in a clean with clean saline nightly.  Wash your contacts with an enzymatic cleaner once weekly and rinse them thoroughly with new, clean contact lens solution to remove any enzymatic residue.  If you have any worsening of your symptoms such as increased pain, increased redness, increased photosensitivity, or changes in your vision you need to follow-up with your eye doctor.   Take Augmentin twice daily with food for 10 days.   Use the Atrovent nasal spray, 2 squirts in each nostril every 6 hours, as needed for runny nose and postnasal drip.  Use the Tessalon Perles every 8 hours during the day.  Take them with a small sip of water.  They may give you some numbness to the base of your tongue or a metallic taste in your mouth, this is normal.  Use the Promethazine DM cough syrup at bedtime for cough and congestion.  It will make you drowsy so do not take it during the day.  Return for reevaluation or see your primary care provider for any new or worsening symptoms.      ED Prescriptions     Medication Sig Dispense Auth. Provider   tobramycin-dexamethasone [facility]) ophthalmic solution Place 2 drops into the right eye every 6 (six) hours. 5 mL Becky Augusta, NP   amoxicillin-clavulanate (AUGMENTIN) 875-125 MG tablet Take 1 tablet by mouth every 12 (twelve) hours for 10 days. 20 tablet Becky Augusta, NP   ipratropium (ATROVENT) 0.06 % nasal spray Place 2 sprays  into both nostrils 4 (four) times daily. 15 mL Becky Augusta, NP   benzonatate (TESSALON) 100 MG capsule Take 2 capsules (200 mg total) by mouth every 8 (eight) hours. 21 capsule Becky Augusta, NP   promethazine-dextromethorphan (PROMETHAZINE-DM) 6.25-15 MG/5ML syrup Take 5 mLs by mouth 4 (four) times daily as needed. 118 mL Becky Augusta, NP      PDMP not reviewed this encounter.   Becky Augusta, NP 09/14/21 1229

## 2021-09-14 NOTE — Discharge Instructions (Addendum)
Instill 2 drops of TobraDex in your right eye 4 times a day for 5 days.  Abstain from wearing contacts until your symptoms have completely resolved.  When you do resume wearing contacts start with a fresh pair.  Make sure that you are storing your contracts in a clean with clean saline nightly.  Wash your contacts with an enzymatic cleaner once weekly and rinse them thoroughly with new, clean contact lens solution to remove any enzymatic residue.  If you have any worsening of your symptoms such as increased pain, increased redness, increased photosensitivity, or changes in your vision you need to follow-up with your eye doctor.   Take Augmentin twice daily with food for 10 days.   Use the Atrovent nasal spray, 2 squirts in each nostril every 6 hours, as needed for runny nose and postnasal drip.  Use the Tessalon Perles every 8 hours during the day.  Take them with a small sip of water.  They may give you some numbness to the base of your tongue or a metallic taste in your mouth, this is normal.  Use the Promethazine DM cough syrup at bedtime for cough and congestion.  It will make you drowsy so do not take it during the day.  Return for reevaluation or see your primary care provider for any new or worsening symptoms.

## 2022-01-21 ENCOUNTER — Ambulatory Visit
Admission: EM | Admit: 2022-01-21 | Discharge: 2022-01-21 | Disposition: A | Discharge status: Home / Self Care | Payer: Medicaid Other

## 2022-01-21 DIAGNOSIS — J069 Acute upper respiratory infection, unspecified: Secondary | ICD-10-CM

## 2022-01-21 DIAGNOSIS — J329 Chronic sinusitis, unspecified: Secondary | ICD-10-CM | POA: Diagnosis not present

## 2022-01-21 DIAGNOSIS — R059 Cough, unspecified: Secondary | ICD-10-CM

## 2022-01-21 MED ORDER — AZITHROMYCIN 250 MG PO TABS: ORAL_TABLET | ORAL | 0 refills | Status: DC

## 2022-01-21 MED ORDER — FLUTICASONE PROPIONATE 50 MCG/ACT NA SUSP: 1.0000 | Freq: Every day | NASAL | 2 refills | Status: AC

## 2022-01-21 MED ORDER — PROMETHAZINE-DM 6.25-15 MG/5ML PO SYRP: 5.0000 mL | ORAL_SOLUTION | Freq: Four times a day (QID) | ORAL | 0 refills | Status: DC | PRN

## 2022-01-21 NOTE — Discharge Instructions (Addendum)
-  For now, would recommend symptom management for treatment. -Flonase: 1 spray to each nostril daily in the morning and then a oral medication at night (Allegra, Claritin, Zyrtec) -Can also use nasal saline rinse or Nettie pot like product to help rinse out the sinuses -Can also use over-the-counter Afrin for nasal congestion. -Ibuprofen Tylenol as needed for pain -If no symptom improvement in next 2-3 days or should symptoms worsen, can start the antibiotic. -Follow with primary care as needed

## 2022-01-21 NOTE — ED Provider Notes (Signed)
[facility]    CSN: [id number] Arrival date & time: 01/21/22  1250      History   Chief Complaint Chief Complaint  Patient presents with   Cough   Headache   Facial Pain    HPI Kayla Howell is a 41 y.o. female.   Patient is a 41 year old female who presents with chief complaint of upper respiratory symptoms and concern for sinus infection.  She was seen at a [facility] ER yesterday diagnosed with a viral sinusitis and discharged without any prescriptions and only instruction was ibuprofen and Tylenol for pain.  She states she was given Flonase in the ER yesterday but did not feel like it had any improvement her symptoms.  Patient reports cough has been persistent and causing some stomach discomfort that is been ongoing x5 days.  She is also tried Mucinex but does not seem to help.  She was also reporting unable to sleep last night.  Again she tried Mucinex, steam, and hot showers without much improvement.  Patient also reports some left ear pain.  Patient's son is here in the clinic as well with similar symptoms  Past Medical History:  Diagnosis Date   Anxiety    GERD (gastroesophageal reflux disease)    Medical history non-contributory     Patient Active Problem List   Diagnosis Date Noted   Dysphagia 04/09/2020   Weight loss 04/09/2020   Anxiety 02/23/2020   Gastroesophageal reflux disease 01/28/2020   Somatic dysfunction of rib 01/28/2020   Pregnancy 09/16/2015   Fe deficiency anemia 05/07/2015   Late prenatal care 05/06/2015   Obesity (BMI 30-39.9) 05/06/2015   Oligohydramnios, delivered 05/06/2015   Postpartum depression 05/06/2015   Tobacco smoking affecting pregnancy in second trimester 05/06/2015    Past Surgical History:  Procedure Laterality Date   ABDOMINAL HYSTERECTOMY     NO PAST SURGERIES      OB History     Gravida  9   Para  7   Term  7   Preterm  0   AB  2   Living  0      SAB  1   IAB  1   Ectopic  0   Multiple  0    Live Births               Home Medications    Prior to Admission medications   Medication Sig Start Date End Date Taking? Authorizing Provider  azithromycin (ZITHROMAX Z-PAK) 250 MG tablet Take 2 tablets by mouth the first day followed by one tablet daily for next 4 days. 01/21/22  Yes Candis Schatz, PA-C  citalopram (CELEXA) 10 MG tablet Take 20 mg by mouth daily. 01/03/22  Yes [provider]  fluticasone (FLONASE) 50 MCG/ACT nasal spray Place 1 spray into both nostrils daily. 01/21/22  Yes Candis Schatz, PA-C  omeprazole (PRILOSEC) 40 MG capsule Take 40 mg by mouth 2 (two) times daily. 10/02/21  Yes [provider]  promethazine-dextromethorphan (PROMETHAZINE-DM) 6.25-15 MG/5ML syrup Take 5 mLs by mouth 4 (four) times daily as needed for cough. 01/21/22  Yes Candis Schatz, PA-C  ALPRAZolam Prudy Feeler) 0.5 MG tablet Take 0.5 mg by mouth 3 (three) times daily as needed. 06/22/20   [provider]  benzonatate (TESSALON) 100 MG capsule Take 2 capsules (200 mg total) by mouth every 8 (eight) hours. 09/14/21   Becky Augusta, NP  busPIRone (BUSPAR) 5 MG tablet Take by mouth. 04/16/20   [provider]  fluticasone (FLONASE) 50 MCG/ACT nasal spray Place 2 sprays into both nostrils daily. 08/05/19   Daphine Deutscher, Mary-Margaret, FNP  ipratropium (ATROVENT) 0.06 % nasal spray Place 2 sprays into both nostrils 4 (four) times daily. 09/14/21   Becky Augusta, NP  Iron-FA-B Cmp-C-Biot-Probiotic (FUSION PLUS) CAPS Take 1 capsule by mouth daily. 05/07/15   [provider]  LORazepam (ATIVAN) 1 MG tablet Take 1 mg by mouth daily as needed. 04/07/20   [provider]  nitroGLYCERIN (NITROSTAT) 0.3 MG SL tablet Place 1 tablet (0.3 mg total) under the tongue every 5 (five) minutes as needed (chest pain/esophageal spasm, up to 3 times). 09/26/20   Shaune Pollack, MD  promethazine-dextromethorphan (PROMETHAZINE-DM) 6.25-15 MG/5ML syrup Take 5 mLs by mouth 4 (four)  times daily as needed. 09/14/21   Becky Augusta, NP  promethazine-phenylephrine (PROMETHAZINE VC) 6.25-5 MG/5ML SYRP Take 5 mLs by mouth every 6 (six) hours as needed for congestion. 04/07/21   Becky Augusta, NP  sucralfate (CARAFATE) 1 g tablet Take by mouth. 04/16/20 05/16/20  [provider]  tobramycin-dexamethasone Wallene Dales) ophthalmic solution Place 2 drops into the right eye every 6 (six) hours. 09/14/21   Becky Augusta, NP  tranexamic acid (LYSTEDA) 650 MG TABS tablet PLEASE SEE ATTACHED FOR DETAILED DIRECTIONS 01/26/19   [provider]  venlafaxine XR (EFFEXOR-XR) 37.5 MG 24 hr capsule Take 37.5 mg by mouth daily. 08/09/20   [provider]    Family History Family History  Problem Relation Age of Onset   Healthy Mother    Healthy Father     Social History Social History   Tobacco Use   Smoking status: Every Day    Packs/day: 1.00    Types: Cigarettes   Smokeless tobacco: Never  Vaping Use   Vaping Use: Never used  Substance Use Topics   Alcohol use: No   Drug use: No     Allergies   Patient has no known allergies.   Review of Systems Review of Systems as above in HPI.  Other systems reviewed and found to be negative   Physical Exam Triage Vital Signs ED Triage Vitals  Enc Vitals Group     BP 01/21/22 1301 (!) 170/91     Pulse Rate 01/21/22 1301 76     Resp --      Temp 01/21/22 1301 97.8 F (36.6 C)     Temp Source 01/21/22 1301 Oral     SpO2 01/21/22 1301 100 %     Weight 01/21/22 1300 220 lb (99.8 kg)     Height 01/21/22 1300 5\' 10"  (1.778 m)     Head Circumference --      Peak Flow --      Pain Score 01/21/22 1259 6     Pain Loc --      Pain Edu? --      Excl. in GC? --    No data found.  Updated Vital Signs BP (!) 170/91 (BP Location: Left Arm)   Pulse 76   Temp 97.8 F (36.6 C) (Oral)   Ht 5\' 10"  (1.778 m)   Wt 220 lb (99.8 kg)   LMP 06/02/2019   SpO2 100%   BMI 31.57 kg/m   Visual Acuity Right Eye Distance:    Left Eye Distance:   Bilateral Distance:    Right Eye Near:   Left Eye Near:    Bilateral Near:     Physical Exam Constitutional:      Appearance: She is  well-developed.  HENT:     Head: Normocephalic and atraumatic.     Right Ear: Ear canal normal. A middle ear effusion is present. Tympanic membrane is not erythematous.     Left Ear: Ear canal normal. A middle ear effusion is present. Tympanic membrane is not erythematous.     Nose:     Right Sinus: Maxillary sinus tenderness and frontal sinus tenderness present.     Left Sinus: Maxillary sinus tenderness and frontal sinus tenderness present.     Mouth/Throat:     Mouth: Mucous membranes are moist.     Pharynx: Oropharynx is clear. Posterior oropharyngeal erythema (mild with clear post nasal drainage) present.     Tonsils: No tonsillar exudate or tonsillar abscesses.  Cardiovascular:     Rate and Rhythm: Normal rate and regular rhythm.     Pulses: Normal pulses.  Pulmonary:     Effort: Pulmonary effort is normal. No respiratory distress.     Breath sounds: Normal breath sounds. No wheezing or rhonchi.  Musculoskeletal:     Cervical back: Normal range of motion.  Lymphadenopathy:     Cervical: No cervical adenopathy.  Skin:    General: Skin is warm and dry.  Neurological:     General: No focal deficit present.     Mental Status: She is alert and oriented to person, place, and time.      UC Treatments / Results  Labs (all labs ordered are listed, but only abnormal results are displayed) Labs Reviewed - No data to display  EKG   Radiology No results found.  Procedures Procedures (including critical care time)  Medications Ordered in UC Medications - No data to display  Initial Impression / Assessment and Plan / UC Course  I have reviewed the triage vital signs and the nursing notes.  Pertinent labs & imaging results that were available during my care of the patient were reviewed by me and considered in my  medical decision making (see chart for details).    Patient with upper respiratory symptoms including sinus tenderness, nasal congestion, bilateral ear effusion and cough.  Respiratory viral panel done yesterday at [facility] was negative.  Chest x-ray was negative.  She is only given Flonase x1 in the clinic with no prescription or follow-up instruction to continue it. Final Clinical Impressions(s) / UC Diagnoses   Final diagnoses:  Upper respiratory tract infection, unspecified type  Chronic sinusitis, unspecified location  Cough, unspecified type     Discharge Instructions      -For now, would recommend symptom management for treatment. -Flonase: 1 spray to each nostril daily in the morning and then a oral medication at night (Allegra, Claritin, Zyrtec) -Can also use nasal saline rinse or Nettie pot like product to help rinse out the sinuses -Can also use over-the-counter Afrin for nasal congestion. -Ibuprofen Tylenol as needed for pain -If no symptom improvement in next 2-3 days or should symptoms worsen, can start the antibiotic. -Follow with primary care as needed     ED Prescriptions     Medication Sig Dispense Auth. Provider   promethazine-dextromethorphan (PROMETHAZINE-DM) 6.25-15 MG/5ML syrup Take 5 mLs by mouth 4 (four) times daily as needed for cough. 118 mL Luvenia Redden, PA-C   fluticasone [facility]) 50 MCG/ACT nasal spray Place 1 spray into both nostrils daily. 16 g Luvenia Redden, PA-C   azithromycin (ZITHROMAX Z-PAK) 250 MG tablet Take 2 tablets by mouth the first day followed by one tablet daily for next 4 days.  6 tablet Candis Schatz, PA-C      PDMP not reviewed this encounter.   Candis Schatz, PA-C 01/21/22 1333

## 2022-01-21 NOTE — ED Triage Notes (Signed)
Pt c/o cough, congestion x5 days, head pressure. Stopped  up nose, pt states she was tested for covid/flu/rsv yesterday (all negative) pt denies any fevers

## 2022-05-04 ENCOUNTER — Telehealth: Payer: Medicaid Other | Admitting: Nurse Practitioner

## 2022-05-04 ENCOUNTER — Encounter: Payer: Medicaid Other | Admitting: Nurse Practitioner

## 2022-05-04 ENCOUNTER — Encounter: Payer: Self-pay | Admitting: Nurse Practitioner

## 2022-05-04 DIAGNOSIS — J0110 Acute frontal sinusitis, unspecified: Secondary | ICD-10-CM

## 2022-05-04 MED ORDER — FLUTICASONE PROPIONATE 50 MCG/ACT NA SUSP: 2.0000 | Freq: Every day | NASAL | 6 refills | Status: AC

## 2022-05-04 MED ORDER — CEFDINIR 250 MG/5ML PO SUSR: 300.0000 mg | Freq: Two times a day (BID) | ORAL | 0 refills | Status: AC

## 2022-05-04 NOTE — Progress Notes (Signed)
Virtual Visit Consent   Kayla Howell, you are scheduled for a virtual visit with a [facility] provider today. Just as with appointments in the office, your consent must be obtained to participate. Your consent will be active for this visit and any virtual visit you may have with one of our providers in the next 365 days. If you have a MyChart account, a copy of this consent can be sent to you electronically.  As this is a virtual visit, video technology does not allow for your provider to perform a traditional examination. This may limit your provider's ability to fully assess your condition. If your provider identifies any concerns that need to be evaluated in person or the need to arrange testing (such as labs, EKG, etc.), we will make arrangements to do so. Although advances in technology are sophisticated, we cannot ensure that it will always work on either your end or our end. If the connection with a video visit is poor, the visit may have to be switched to a telephone visit. With either a video or telephone visit, we are not always able to ensure that we have a secure connection.  By engaging in this virtual visit, you consent to the provision of healthcare and authorize for your insurance to be billed (if applicable) for the services provided during this visit. Depending on your insurance coverage, you may receive a charge related to this service.  I need to obtain your verbal consent now. Are you willing to proceed with your visit today? Ishika P Chaffin has provided verbal consent on 05/04/2022 for a virtual visit (video or telephone). Apolonio Schneiders, FNP  Date: 05/04/2022 10:39 AM  Virtual Visit via Video Note   I, Apolonio Schneiders, connected with  TURA ROLLER  ([id number], [birth year]) on 05/04/22 at 10:30 AM EST by a video-enabled telemedicine application and verified that I am speaking with the correct person using two identifiers.  Location: Patient: Virtual Visit Location Patient:  Home Provider: Virtual Visit Location Provider: Home Office   I discussed the limitations of evaluation and management by telemedicine and the availability of in person appointments. The patient expressed understanding and agreed to proceed.    History of Present Illness: Cate P Klump is a 42 y.o. who identifies as a female who was assigned female at birth, and is being seen today for congestion.  She is very prone to sinus infections  She gets on average one a year   She has been sick for the past week   She has had negative COVID testing   She is having pressure in her cheeks, forehead  She has been taking hot showers and using over the counter medications and nasal sprays without relief.   Last Sinus infection was in the summer and she was treated with Cefdinir liquid form due to underling jackhammer esophageal disease    Problems:  Patient Active Problem List   Diagnosis Date Noted   Dysphagia 04/09/2020   Weight loss 04/09/2020   Anxiety 02/23/2020   Gastroesophageal reflux disease 01/28/2020   Somatic dysfunction of rib 01/28/2020   Pregnancy 09/16/2015   Fe deficiency anemia 05/07/2015   Late prenatal care 05/06/2015   Obesity (BMI 30-39.9) 05/06/2015   Oligohydramnios, delivered 05/06/2015   Postpartum depression 05/06/2015   Tobacco smoking affecting pregnancy in second trimester 05/06/2015    Allergies: No Known Allergies Medications:  Current Outpatient Medications:    ALPRAZolam (XANAX) 0.5 MG tablet, Take 0.5 mg by mouth 3 (three)  times daily as needed., Disp: , Rfl:    azithromycin (ZITHROMAX Z-PAK) 250 MG tablet, Take 2 tablets by mouth the first day followed by one tablet daily for next 4 days., Disp: 6 tablet, Rfl: 0   benzonatate (TESSALON) 100 MG capsule, Take 2 capsules (200 mg total) by mouth every 8 (eight) hours., Disp: 21 capsule, Rfl: 0   busPIRone (BUSPAR) 5 MG tablet, Take by mouth., Disp: , Rfl:    citalopram (CELEXA) 10 MG tablet, Take 20 mg  by mouth daily., Disp: , Rfl:    fluticasone (FLONASE) 50 MCG/ACT nasal spray, Place 2 sprays into both nostrils daily., Disp: 16 g, Rfl: 6   fluticasone (FLONASE) 50 MCG/ACT nasal spray, Place 1 spray into both nostrils daily., Disp: 16 g, Rfl: 2   ipratropium (ATROVENT) 0.06 % nasal spray, Place 2 sprays into both nostrils 4 (four) times daily., Disp: 15 mL, Rfl: 12   Iron-FA-B Cmp-C-Biot-Probiotic (FUSION PLUS) CAPS, Take 1 capsule by mouth daily., Disp: , Rfl:    LORazepam (ATIVAN) 1 MG tablet, Take 1 mg by mouth daily as needed., Disp: , Rfl:    nitroGLYCERIN (NITROSTAT) 0.3 MG SL tablet, Place 1 tablet (0.3 mg total) under the tongue every 5 (five) minutes as needed (chest pain/esophageal spasm, up to 3 times)., Disp: 90 tablet, Rfl: 0   omeprazole (PRILOSEC) 40 MG capsule, Take 40 mg by mouth 2 (two) times daily., Disp: , Rfl:    promethazine-dextromethorphan (PROMETHAZINE-DM) 6.25-15 MG/5ML syrup, Take 5 mLs by mouth 4 (four) times daily as needed., Disp: 118 mL, Rfl: 0   promethazine-dextromethorphan (PROMETHAZINE-DM) 6.25-15 MG/5ML syrup, Take 5 mLs by mouth 4 (four) times daily as needed for cough., Disp: 118 mL, Rfl: 0   promethazine-phenylephrine (PROMETHAZINE VC) 6.25-5 MG/5ML SYRP, Take 5 mLs by mouth every 6 (six) hours as needed for congestion., Disp: 180 mL, Rfl: 0   sucralfate (CARAFATE) 1 g tablet, Take by mouth., Disp: , Rfl:    tobramycin-dexamethasone (TOBRADEX) ophthalmic solution, Place 2 drops into the right eye every 6 (six) hours., Disp: 5 mL, Rfl: 0   tranexamic acid (LYSTEDA) 650 MG TABS tablet, PLEASE SEE ATTACHED FOR DETAILED DIRECTIONS, Disp: , Rfl:    venlafaxine XR (EFFEXOR-XR) 37.5 MG 24 hr capsule, Take 37.5 mg by mouth daily., Disp: , Rfl:   Observations/Objective: Patient is well-developed, well-nourished in no acute distress.  Resting comfortably  at home.  Head is normocephalic, atraumatic.  No labored breathing.  Speech is clear and coherent with logical  content.  Patient is alert and oriented at baseline.    Assessment and Plan: 1. Acute non-recurrent frontal sinusitis  - cefdinir (OMNICEF) 250 MG/5ML suspension; Take 6 mLs (300 mg total) by mouth 2 (two) times daily for 7 days.  Dispense: 84 mL; Refill: 0 - fluticasone (FLONASE) 50 MCG/ACT nasal spray; Place 2 sprays into both nostrils daily.  Dispense: 16 g; Refill: 6     Follow Up Instructions: I discussed the assessment and treatment plan with the patient. The patient was provided an opportunity to ask questions and all were answered. The patient agreed with the plan and demonstrated an understanding of the instructions.  A copy of instructions were sent to the patient via MyChart unless otherwise noted below.    The patient was advised to call back or seek an in-person evaluation if the symptoms worsen or if the condition fails to improve as anticipated.  Time:  I spent 10 minutes with the patient via telehealth technology discussing  the above problems/concerns.    Apolonio Schneiders, FNP

## 2022-05-04 NOTE — Progress Notes (Signed)
Duplicate visits

## 2022-06-05 DIAGNOSIS — E669 Obesity, unspecified: Secondary | ICD-10-CM | POA: Diagnosis not present

## 2022-06-05 DIAGNOSIS — F17210 Nicotine dependence, cigarettes, uncomplicated: Secondary | ICD-10-CM | POA: Diagnosis not present

## 2022-06-05 DIAGNOSIS — K219 Gastro-esophageal reflux disease without esophagitis: Secondary | ICD-10-CM | POA: Diagnosis not present

## 2022-06-05 DIAGNOSIS — D649 Anemia, unspecified: Secondary | ICD-10-CM | POA: Diagnosis not present

## 2022-06-05 DIAGNOSIS — R1011 Right upper quadrant pain: Secondary | ICD-10-CM | POA: Diagnosis not present

## 2022-06-05 DIAGNOSIS — F419 Anxiety disorder, unspecified: Secondary | ICD-10-CM | POA: Diagnosis not present

## 2022-06-05 DIAGNOSIS — R112 Nausea with vomiting, unspecified: Secondary | ICD-10-CM | POA: Diagnosis not present

## 2022-06-05 DIAGNOSIS — K8020 Calculus of gallbladder without cholecystitis without obstruction: Secondary | ICD-10-CM | POA: Diagnosis not present

## 2022-06-05 DIAGNOSIS — Z79899 Other long term (current) drug therapy: Secondary | ICD-10-CM | POA: Diagnosis not present

## 2022-06-05 DIAGNOSIS — R9341 Abnormal radiologic findings on diagnostic imaging of renal pelvis, ureter, or bladder: Secondary | ICD-10-CM | POA: Diagnosis not present

## 2022-06-29 DIAGNOSIS — R1011 Right upper quadrant pain: Secondary | ICD-10-CM | POA: Diagnosis not present

## 2022-06-29 DIAGNOSIS — I10 Essential (primary) hypertension: Secondary | ICD-10-CM | POA: Diagnosis not present

## 2022-06-29 DIAGNOSIS — R0789 Other chest pain: Secondary | ICD-10-CM | POA: Diagnosis not present

## 2022-07-06 DIAGNOSIS — F17210 Nicotine dependence, cigarettes, uncomplicated: Secondary | ICD-10-CM | POA: Diagnosis not present

## 2022-07-06 DIAGNOSIS — R1310 Dysphagia, unspecified: Secondary | ICD-10-CM | POA: Diagnosis not present

## 2022-07-06 DIAGNOSIS — R1011 Right upper quadrant pain: Secondary | ICD-10-CM | POA: Diagnosis not present

## 2022-07-06 DIAGNOSIS — Z79899 Other long term (current) drug therapy: Secondary | ICD-10-CM | POA: Diagnosis not present

## 2022-07-06 DIAGNOSIS — R202 Paresthesia of skin: Secondary | ICD-10-CM | POA: Diagnosis not present

## 2022-07-06 DIAGNOSIS — R1013 Epigastric pain: Secondary | ICD-10-CM | POA: Diagnosis not present

## 2022-07-06 DIAGNOSIS — R002 Palpitations: Secondary | ICD-10-CM | POA: Diagnosis not present

## 2022-07-06 DIAGNOSIS — K219 Gastro-esophageal reflux disease without esophagitis: Secondary | ICD-10-CM | POA: Diagnosis not present

## 2022-07-07 DIAGNOSIS — R1013 Epigastric pain: Secondary | ICD-10-CM | POA: Diagnosis not present

## 2022-07-07 DIAGNOSIS — R002 Palpitations: Secondary | ICD-10-CM | POA: Diagnosis not present

## 2022-07-07 DIAGNOSIS — R202 Paresthesia of skin: Secondary | ICD-10-CM | POA: Diagnosis not present

## 2022-07-07 DIAGNOSIS — R1310 Dysphagia, unspecified: Secondary | ICD-10-CM | POA: Diagnosis not present

## 2022-07-07 DIAGNOSIS — R079 Chest pain, unspecified: Secondary | ICD-10-CM | POA: Diagnosis not present

## 2022-07-17 DIAGNOSIS — K8020 Calculus of gallbladder without cholecystitis without obstruction: Secondary | ICD-10-CM | POA: Diagnosis not present

## 2022-07-30 DIAGNOSIS — R1319 Other dysphagia: Secondary | ICD-10-CM | POA: Diagnosis not present

## 2022-08-07 DIAGNOSIS — Z6833 Body mass index (BMI) 33.0-33.9, adult: Secondary | ICD-10-CM | POA: Diagnosis not present

## 2022-08-07 DIAGNOSIS — G43009 Migraine without aura, not intractable, without status migrainosus: Secondary | ICD-10-CM | POA: Diagnosis not present

## 2022-08-07 DIAGNOSIS — F17210 Nicotine dependence, cigarettes, uncomplicated: Secondary | ICD-10-CM | POA: Diagnosis not present

## 2022-08-07 DIAGNOSIS — K219 Gastro-esophageal reflux disease without esophagitis: Secondary | ICD-10-CM | POA: Diagnosis not present

## 2022-08-07 DIAGNOSIS — E669 Obesity, unspecified: Secondary | ICD-10-CM | POA: Diagnosis not present

## 2022-08-13 DIAGNOSIS — F17210 Nicotine dependence, cigarettes, uncomplicated: Secondary | ICD-10-CM | POA: Diagnosis not present

## 2022-08-13 DIAGNOSIS — K219 Gastro-esophageal reflux disease without esophagitis: Secondary | ICD-10-CM | POA: Diagnosis not present

## 2022-08-13 DIAGNOSIS — R079 Chest pain, unspecified: Secondary | ICD-10-CM | POA: Diagnosis not present

## 2022-08-13 DIAGNOSIS — R0789 Other chest pain: Secondary | ICD-10-CM | POA: Diagnosis not present

## 2022-08-13 DIAGNOSIS — R519 Headache, unspecified: Secondary | ICD-10-CM | POA: Diagnosis not present

## 2022-08-13 DIAGNOSIS — R251 Tremor, unspecified: Secondary | ICD-10-CM | POA: Diagnosis not present

## 2022-08-13 DIAGNOSIS — Z9049 Acquired absence of other specified parts of digestive tract: Secondary | ICD-10-CM | POA: Diagnosis not present

## 2022-08-13 DIAGNOSIS — Z79899 Other long term (current) drug therapy: Secondary | ICD-10-CM | POA: Diagnosis not present

## 2022-08-18 DIAGNOSIS — K209 Esophagitis, unspecified without bleeding: Secondary | ICD-10-CM | POA: Diagnosis not present

## 2022-10-15 DIAGNOSIS — R0789 Other chest pain: Secondary | ICD-10-CM | POA: Diagnosis not present

## 2022-10-15 DIAGNOSIS — R079 Chest pain, unspecified: Secondary | ICD-10-CM | POA: Diagnosis not present

## 2022-10-15 DIAGNOSIS — K219 Gastro-esophageal reflux disease without esophagitis: Secondary | ICD-10-CM | POA: Diagnosis not present

## 2022-10-16 DIAGNOSIS — R079 Chest pain, unspecified: Secondary | ICD-10-CM | POA: Diagnosis not present

## 2022-10-27 DIAGNOSIS — Z79899 Other long term (current) drug therapy: Secondary | ICD-10-CM | POA: Diagnosis not present

## 2022-10-27 DIAGNOSIS — F17210 Nicotine dependence, cigarettes, uncomplicated: Secondary | ICD-10-CM | POA: Diagnosis not present

## 2022-10-27 DIAGNOSIS — R21 Rash and other nonspecific skin eruption: Secondary | ICD-10-CM | POA: Diagnosis not present

## 2022-10-27 DIAGNOSIS — L299 Pruritus, unspecified: Secondary | ICD-10-CM | POA: Diagnosis not present

## 2022-10-27 DIAGNOSIS — K219 Gastro-esophageal reflux disease without esophagitis: Secondary | ICD-10-CM | POA: Diagnosis not present

## 2022-10-27 DIAGNOSIS — L989 Disorder of the skin and subcutaneous tissue, unspecified: Secondary | ICD-10-CM | POA: Diagnosis not present

## 2022-10-27 DIAGNOSIS — F419 Anxiety disorder, unspecified: Secondary | ICD-10-CM | POA: Diagnosis not present

## 2022-12-17 DIAGNOSIS — K219 Gastro-esophageal reflux disease without esophagitis: Secondary | ICD-10-CM | POA: Diagnosis not present

## 2022-12-17 DIAGNOSIS — J029 Acute pharyngitis, unspecified: Secondary | ICD-10-CM | POA: Diagnosis not present

## 2022-12-17 DIAGNOSIS — J020 Streptococcal pharyngitis: Secondary | ICD-10-CM | POA: Diagnosis not present

## 2022-12-17 DIAGNOSIS — F17210 Nicotine dependence, cigarettes, uncomplicated: Secondary | ICD-10-CM | POA: Diagnosis not present

## 2022-12-17 DIAGNOSIS — E669 Obesity, unspecified: Secondary | ICD-10-CM | POA: Diagnosis not present

## 2023-01-10 DIAGNOSIS — R0789 Other chest pain: Secondary | ICD-10-CM | POA: Diagnosis not present

## 2023-01-10 DIAGNOSIS — K224 Dyskinesia of esophagus: Secondary | ICD-10-CM | POA: Diagnosis not present

## 2023-01-22 DIAGNOSIS — R079 Chest pain, unspecified: Secondary | ICD-10-CM | POA: Diagnosis not present

## 2023-02-11 DIAGNOSIS — Z6832 Body mass index (BMI) 32.0-32.9, adult: Secondary | ICD-10-CM | POA: Diagnosis not present

## 2023-02-11 DIAGNOSIS — R030 Elevated blood-pressure reading, without diagnosis of hypertension: Secondary | ICD-10-CM | POA: Diagnosis not present

## 2023-02-11 DIAGNOSIS — Z79899 Other long term (current) drug therapy: Secondary | ICD-10-CM | POA: Diagnosis not present

## 2023-02-11 DIAGNOSIS — K219 Gastro-esophageal reflux disease without esophagitis: Secondary | ICD-10-CM | POA: Diagnosis not present

## 2023-02-11 DIAGNOSIS — J069 Acute upper respiratory infection, unspecified: Secondary | ICD-10-CM | POA: Diagnosis not present

## 2023-02-11 DIAGNOSIS — E669 Obesity, unspecified: Secondary | ICD-10-CM | POA: Diagnosis not present

## 2023-02-11 DIAGNOSIS — Z8616 Personal history of COVID-19: Secondary | ICD-10-CM | POA: Diagnosis not present

## 2023-02-11 DIAGNOSIS — F17210 Nicotine dependence, cigarettes, uncomplicated: Secondary | ICD-10-CM | POA: Diagnosis not present

## 2023-02-11 DIAGNOSIS — F419 Anxiety disorder, unspecified: Secondary | ICD-10-CM | POA: Diagnosis not present

## 2023-02-19 DIAGNOSIS — F419 Anxiety disorder, unspecified: Secondary | ICD-10-CM | POA: Diagnosis not present

## 2023-02-19 DIAGNOSIS — Z8616 Personal history of COVID-19: Secondary | ICD-10-CM | POA: Diagnosis not present

## 2023-02-19 DIAGNOSIS — Z79899 Other long term (current) drug therapy: Secondary | ICD-10-CM | POA: Diagnosis not present

## 2023-02-19 DIAGNOSIS — F17290 Nicotine dependence, other tobacco product, uncomplicated: Secondary | ICD-10-CM | POA: Diagnosis not present

## 2023-02-19 DIAGNOSIS — F17210 Nicotine dependence, cigarettes, uncomplicated: Secondary | ICD-10-CM | POA: Diagnosis not present

## 2023-02-19 DIAGNOSIS — J069 Acute upper respiratory infection, unspecified: Secondary | ICD-10-CM | POA: Diagnosis not present

## 2023-02-19 DIAGNOSIS — B9789 Other viral agents as the cause of diseases classified elsewhere: Secondary | ICD-10-CM | POA: Diagnosis not present

## 2023-05-05 DIAGNOSIS — F17210 Nicotine dependence, cigarettes, uncomplicated: Secondary | ICD-10-CM | POA: Diagnosis not present

## 2023-05-05 DIAGNOSIS — K219 Gastro-esophageal reflux disease without esophagitis: Secondary | ICD-10-CM | POA: Diagnosis not present

## 2023-05-05 DIAGNOSIS — X58XXXA Exposure to other specified factors, initial encounter: Secondary | ICD-10-CM | POA: Diagnosis not present

## 2023-05-05 DIAGNOSIS — Z8616 Personal history of COVID-19: Secondary | ICD-10-CM | POA: Diagnosis not present

## 2023-05-05 DIAGNOSIS — S29012A Strain of muscle and tendon of back wall of thorax, initial encounter: Secondary | ICD-10-CM | POA: Diagnosis not present

## 2023-05-05 DIAGNOSIS — Z79899 Other long term (current) drug therapy: Secondary | ICD-10-CM | POA: Diagnosis not present

## 2023-05-05 DIAGNOSIS — F419 Anxiety disorder, unspecified: Secondary | ICD-10-CM | POA: Diagnosis not present

## 2023-05-07 DIAGNOSIS — F17210 Nicotine dependence, cigarettes, uncomplicated: Secondary | ICD-10-CM | POA: Diagnosis not present

## 2023-05-07 DIAGNOSIS — Z90710 Acquired absence of both cervix and uterus: Secondary | ICD-10-CM | POA: Diagnosis not present

## 2023-05-07 DIAGNOSIS — F419 Anxiety disorder, unspecified: Secondary | ICD-10-CM | POA: Diagnosis not present

## 2023-05-07 DIAGNOSIS — Z5941 Food insecurity: Secondary | ICD-10-CM | POA: Diagnosis not present

## 2023-05-07 DIAGNOSIS — M549 Dorsalgia, unspecified: Secondary | ICD-10-CM | POA: Diagnosis not present

## 2023-12-21 ENCOUNTER — Telehealth

## 2023-12-22 ENCOUNTER — Telehealth

## 2024-01-27 ENCOUNTER — Other Ambulatory Visit: Payer: Self-pay

## 2024-01-27 ENCOUNTER — Emergency Department

## 2024-01-27 ENCOUNTER — Emergency Department
Admission: EM | Admit: 2024-01-27 | Discharge: 2024-01-27 | Disposition: A | Discharge status: Home / Self Care | Attending: Emergency Medicine | Admitting: Emergency Medicine

## 2024-01-27 DIAGNOSIS — Y92009 Unspecified place in unspecified non-institutional (private) residence as the place of occurrence of the external cause: Secondary | ICD-10-CM | POA: Diagnosis not present

## 2024-01-27 DIAGNOSIS — M25572 Pain in left ankle and joints of left foot: Secondary | ICD-10-CM | POA: Diagnosis present

## 2024-01-27 DIAGNOSIS — W19XXXA Unspecified fall, initial encounter: Secondary | ICD-10-CM

## 2024-01-27 DIAGNOSIS — M25512 Pain in left shoulder: Secondary | ICD-10-CM | POA: Insufficient documentation

## 2024-01-27 DIAGNOSIS — W1839XA Other fall on same level, initial encounter: Secondary | ICD-10-CM | POA: Diagnosis not present

## 2024-01-27 DIAGNOSIS — Y9301 Activity, walking, marching and hiking: Secondary | ICD-10-CM | POA: Insufficient documentation

## 2024-01-27 HISTORY — DX: Panic disorder (episodic paroxysmal anxiety): F41.0

## 2024-01-27 MED ORDER — KETOROLAC TROMETHAMINE 30 MG/ML IJ SOLN
30.0000 mg | Freq: Once | INTRAMUSCULAR | Status: AC
  Administered 2024-01-27: 30 mg via INTRAMUSCULAR
  Filled 2024-01-27: qty 1

## 2024-01-27 MED ORDER — CYCLOBENZAPRINE HCL 10 MG PO TABS
5.0000 mg | ORAL_TABLET | Freq: Once | ORAL | Status: AC
  Administered 2024-01-27: 5 mg via ORAL
  Filled 2024-01-27: qty 1

## 2024-01-27 MED ORDER — LIDOCAINE 5 % EX PTCH
1.0000 | MEDICATED_PATCH | CUTANEOUS | Status: DC
  Administered 2024-01-27: 1 via TRANSDERMAL
  Filled 2024-01-27: qty 1

## 2024-01-27 MED ORDER — CYCLOBENZAPRINE HCL 5 MG PO TABS: 5.0000 mg | ORAL_TABLET | Freq: Three times a day (TID) | ORAL | 0 refills | Status: AC | PRN

## 2024-01-27 NOTE — ED Notes (Signed)
 See triage note  Presents with cont'd pain to left ankle/shoulder  States she was walking her dog  The dog pulled away and twisted her  States she was given Robaxin  But states that makes her very nauseated   so she doesn't take it

## 2024-01-27 NOTE — Discharge Instructions (Addendum)
 You were evaluated in the ED following a fall.  Your left shoulder and left ankle x-ray is normal and shows no fracture or broken bone.   Your blood pressure was notably elevated today.  Please follow-up with your primary care provider as soon as you can to discuss possible medication management.  If you experience any new or worsening symptoms return to ED for further evaluation.

## 2024-01-27 NOTE — ED Triage Notes (Signed)
 Patient states she fell a couple of days ago while walking her dog. Complaining of pain to left side of body but mainly in left ankle and left shoulder.

## 2024-01-27 NOTE — ED Provider Notes (Signed)
 [facility] Emergency Department Provider Note     Event Date/Time   First MD Initiated Contact with Patient 01/27/24 1205     (approximate)   History   Fall   HPI  Kayla Howell is a 43 y.o. female with a PMHx of anxiety and GERD presents with complaint of left ankle pain and left shoulder pain following a fall a couple of days ago. She states she was walking her dog at home and the dog pulled her causing her to be yanked forward. Denies head injury or LOC. She has tried methocarbamol, but reports side effects that caused her to discontinue them.     Physical Exam   Triage Vital Signs: ED Triage Vitals  Encounter Vitals Group     BP 01/27/24 1118 (!) 172/111     Girls Systolic BP Percentile --      Girls Diastolic BP Percentile --      Boys Systolic BP Percentile --      Boys Diastolic BP Percentile --      Pulse Rate 01/27/24 1117 81     Resp 01/27/24 1117 18     Temp 01/27/24 1117 97.9 F (36.6 C)     Temp Source 01/27/24 1117 Oral     SpO2 01/27/24 1117 100 %     Weight 01/27/24 1118 224 lb (101.6 kg)     Height 01/27/24 1118 5' 10 (1.778 m)     Head Circumference --      Peak Flow --      Pain Score 01/27/24 1117 7     Pain Loc --      Pain Education --      Exclude from Growth Chart --     Most recent vital signs: Vitals:   01/27/24 1117 01/27/24 1118  BP:  (!) 172/111  Pulse: 81   Resp: 18   Temp: 97.9 F (36.6 C)   SpO2: 100%     General Awake, no distress.  HEENT NCAT. PERRL. EOMI.  CV:  Good peripheral perfusion.  RESP:  Normal effort.  ABD:  No distention.  Other:  Left ankle with no deformities. Nontender. Painful with dorsiflexion. Pedal pulses palpated.   Right shoulder with no deformity. FROM. Mild tenderness to lateral latissimus muscle.    ED Results / Procedures / Treatments   Labs (all labs ordered are listed, but only abnormal results are displayed) Labs Reviewed - No data to  display  RADIOLOGY  I personally viewed and evaluated these images as part of my medical decision making, as well as reviewing the written report by the radiologist.  DG Shoulder Left Result Date: 01/27/2024 EXAM: 1 VIEW XRAY OF THE LEFT SHOULDER 01/27/2024 12:02:04 PM COMPARISON: None available. CLINICAL HISTORY: Pain post fall. FINDINGS: BONES AND JOINTS: Glenohumeral joint is normally aligned. No acute fracture or dislocation. [facility] joint is unremarkable in appearance. SOFT TISSUES: No abnormal calcifications. Visualized lung is unremarkable. IMPRESSION: 1. No significant abnormality. Electronically signed by: Lynwood Seip MD 01/27/2024 12:27 PM EDT RP Workstation: [id number]   DG Ankle Complete Left Result Date: 01/27/2024 EXAM: 3 or more VIEW(S) XRAY OF THE LEFT ANKLE 01/27/2024 12:02:04 PM CLINICAL HISTORY: Left ankle  pain post fall. COMPARISON: None available. FINDINGS: BONES AND JOINTS: No acute fracture. No focal osseous lesion. No joint dislocation. SOFT TISSUES: The soft tissues are unremarkable. IMPRESSION: 1. No acute osseous abnormality. Electronically signed by: Lynwood Seip MD 01/27/2024 12:26 PM EDT RP Workstation: [id number]  PROCEDURES:  Critical Care performed: No  Procedures   MEDICATIONS ORDERED IN ED: Medications  ketorolac  (TORADOL ) 30 MG/ML injection 30 mg (30 mg Intramuscular Given 01/27/24 1239)  cyclobenzaprine (FLEXERIL) tablet 5 mg (5 mg Oral Given 01/27/24 1239)     IMPRESSION / MDM / ASSESSMENT AND PLAN / ED COURSE  I reviewed the triage vital signs and the nursing notes.                              Clinical Course as of 01/27/24 1951  Thu Jan 27, 2024  1256 DG Ankle Complete Left IMPRESSION: 1. No acute osseous abnormality.   [MH]  1256 DG Shoulder Left IMPRESSION: 1. No significant abnormality.   [MH]    Clinical Course User Index [MH] Kayla Howell LABOR, PA-C    43 y.o. female presents to the emergency department for evaluation  and treatment of left ankle and left shoulder pain. See HPI for further details.   Differential diagnosis includes, but is not limited to fracture, dislocation, muscular strain, ligamentous injury considered but less likely  Patient's presentation is most consistent with acute complicated illness / injury requiring diagnostic workup.  Patient is alert and oriented.  She is hemodynamically.  Noted elevated BP of 172/111 noted.  This appears to be patient's pain.  She reports history of hypertension secondary to her anxiety and panic has not yet managed but is working on primary care.  Patient exam findings are as stated above and overall benign.  Low suspicion for fracture or dislocation.  X-rays are reassuring.  Patient provided an ace wrap. RICE therapy education provided. The patient is in stable and satisfactory condition for discharge home. Encouraged to follow up with PCP for further management of elevated BP. ED precautions discussed. All questions and concerns were addressed during this ED visit.     FINAL CLINICAL IMPRESSION(S) / ED DIAGNOSES   Final diagnoses:  Fall, initial encounter  Acute left ankle pain  Acute pain of left shoulder     Rx / DC Orders   ED Discharge Orders          Ordered    cyclobenzaprine (FLEXERIL) 5 MG tablet  3 times daily PRN        01/27/24 1308             Note:  This document was prepared using Dragon voice recognition software and may include unintentional dictation errors.     Kayla, Kayla Howell A, PA-C 01/27/24 1951    Kayla Lorelle Cummins, MD 01/28/24 1224

## 2024-03-16 ENCOUNTER — Telehealth: Admitting: Physician Assistant

## 2024-03-16 ENCOUNTER — Telehealth: Admitting: Family Medicine

## 2024-03-16 DIAGNOSIS — F419 Anxiety disorder, unspecified: Secondary | ICD-10-CM

## 2024-03-16 DIAGNOSIS — Z760 Encounter for issue of repeat prescription: Secondary | ICD-10-CM

## 2024-03-16 NOTE — Progress Notes (Signed)
 Pt did not show as she was advised earlier we do not prescribe controlled medications==Klonopin. [facility]

## 2024-03-16 NOTE — Progress Notes (Signed)
 Message sent to patient, awaiting response.

## 2024-03-16 NOTE — Progress Notes (Signed)
 Unfortunately, Klonipin is a medication that we are unable to refill in this platform. You will need to be seen by your PCP for refills.     NOTE: There will be NO CHARGE for this E-Visit   If you are having a true medical emergency, please call 911.     For an urgent face to face visit, [place] has multiple urgent care centers for your convenience.  Click the link below for the full list of locations and hours, walk-in wait times, appointment scheduling options and driving directions:  Urgent Care - [place], [place], [place], [place], [place], [place]  [place]     Your MyChart E-visit questionnaire answers were reviewed by a board certified advanced clinical practitioner to complete your personal care plan based on your specific symptoms.    Thank you for using e-Visits.

## 2024-03-22 ENCOUNTER — Other Ambulatory Visit: Payer: Self-pay

## 2024-03-22 ENCOUNTER — Encounter: Payer: Self-pay | Admitting: Emergency Medicine

## 2024-03-22 ENCOUNTER — Emergency Department
Admission: EM | Admit: 2024-03-22 | Discharge: 2024-03-22 | Disposition: A | Discharge status: Home / Self Care | Attending: Emergency Medicine | Admitting: Emergency Medicine

## 2024-03-22 DIAGNOSIS — J069 Acute upper respiratory infection, unspecified: Secondary | ICD-10-CM | POA: Insufficient documentation

## 2024-03-22 DIAGNOSIS — F419 Anxiety disorder, unspecified: Secondary | ICD-10-CM | POA: Diagnosis not present

## 2024-03-22 DIAGNOSIS — B9789 Other viral agents as the cause of diseases classified elsewhere: Secondary | ICD-10-CM | POA: Diagnosis not present

## 2024-03-22 DIAGNOSIS — R059 Cough, unspecified: Secondary | ICD-10-CM | POA: Diagnosis present

## 2024-03-22 LAB — RESP PANEL BY RT-PCR (RSV, FLU A&B, COVID)  RVPGX2
Influenza A by PCR: NEGATIVE
Influenza B by PCR: NEGATIVE
Resp Syncytial Virus by PCR: NEGATIVE
SARS Coronavirus 2 by RT PCR: NEGATIVE

## 2024-03-22 LAB — GROUP A STREP BY PCR: Group A Strep by PCR: NOT DETECTED

## 2024-03-22 MED ORDER — PROMETHAZINE-DM 6.25-15 MG/5ML PO SYRP: 1.2500 mL | ORAL_SOLUTION | Freq: Four times a day (QID) | ORAL | 0 refills | Status: AC | PRN

## 2024-03-22 MED ORDER — CLONAZEPAM 0.5 MG PO TABS: 0.5000 mg | ORAL_TABLET | Freq: Three times a day (TID) | ORAL | 0 refills | Status: AC | PRN

## 2024-03-22 NOTE — ED Triage Notes (Signed)
 Patient arrives ambulatory by POV c/o stuffy nose, burning to throat and cough. Reports contacts with viral illness.

## 2024-03-22 NOTE — ED Provider Notes (Signed)
 [facility] Emergency Department Provider Note     Event Date/Time   First MD Initiated Contact with Patient 03/22/24 1145     (approximate)   History   Sore Throat and Cough   HPI  Kayla Howell is a 43 y.o. female with a past medical history of anxiety, GERD and panic disorder presents to the ED for evaluation of a persistent cough, sore throat, and congestion.  She has tried Tylenol  and over the counter medication with no relief.  Endorses sick contacts with similar symptoms once her son who symptoms have resolved.  Denies fever.  Denies chest pain or shortness of breath.  Patient is also requesting refill on anti-anxiety medication Klonopin.  She reports her primary care provider who normally prescribes this medication is out of office on vacation so she is unable to contact them.  She reports she takes this alongside Lexapro to help with her anxiety.    Physical Exam   Triage Vital Signs: ED Triage Vitals  Encounter Vitals Group     BP 03/22/24 1109 (!) 168/115     Girls Systolic BP Percentile --      Girls Diastolic BP Percentile --      Boys Systolic BP Percentile --      Boys Diastolic BP Percentile --      Pulse Rate 03/22/24 1109 92     Resp 03/22/24 1109 18     Temp 03/22/24 1109 97.9 F (36.6 C)     Temp Source 03/22/24 1109 Oral     SpO2 03/22/24 1109 98 %     Weight 03/22/24 1111 224 lb (101.6 kg)     Height 03/22/24 1111 5' 10 (1.778 m)     Head Circumference --      Peak Flow --      Pain Score 03/22/24 1109 6     Pain Loc --      Pain Education --      Exclude from Growth Chart --     Most recent vital signs: Vitals:   03/22/24 1109  BP: (!) 168/115  Pulse: 92  Resp: 18  Temp: 97.9 F (36.6 C)  SpO2: 98%   General: Well appearing and comfortable. Alert and oriented. INAD.  Skin:  Warm, dry and intact. No rashes or lesions noted.     Head:  NCAT.  Eyes:  PERRLA.  Ears:  EACs patent. Tympanic membranes clear  bilaterally. No bulging, erythema or discharge.  Nose:   Mucosa is moist. No rhinorrhea. Throat: Oropharynx clear. Mild erythema. No exudates. Uvula is midline. CV:  Good peripheral perfusion. RRR.  RESP:  Normal effort. LCTAB. No retractions.  ABD:  No distention. Soft, Non tender.  NEURO: Cranial nerves intact. No focal deficits.     ED Results / Procedures / Treatments   Labs (all labs ordered are listed, but only abnormal results are displayed) Labs Reviewed  RESP PANEL BY RT-PCR (RSV, FLU A&B, COVID)  [id number]  GROUP A STREP BY PCR   No results found.  PROCEDURES:  Critical Care performed: No  Procedures   MEDICATIONS ORDERED IN ED: Medications - No data to display   IMPRESSION / MDM / ASSESSMENT AND PLAN / ED COURSE  I reviewed the triage vital signs and the nursing notes.                               43  y.o. female presents to the emergency department for evaluation and treatment of acute cough. See HPI for further details.   Differential diagnosis includes, but is not limited to viral URI, influenza, COVID, strep pharyngitis, anxiety  Patient's presentation is most consistent with acute, uncomplicated illness.  Patient is alert and oriented.  She is hemodynamically stable and afebrile.  Physical exam findings are as stated above.  No red flag signs to indicate further workup.  Respiratory panel is negative.  Rapid strep test is negative.  Education on symptomatic care treatment at home provided.  Will send in Promethazine  DM syrup to pharmacy.    Review of the [place] CSRS was performed in accordance of the [facility] prior to dispensing any controlled drugs.  Last dose of Klonopin prescribed 06/25  Short course of Klonopin sent to pharmacy.  Discussed thoroughly that the emergency department should not be used for long-term medication refills on this specific drug unless patient is being evaluated by psychiatric services.  Advised to discuss medication management with primary  care provider.  Patient verbalized understanding.  She does stable condition for discharge home.  FINAL CLINICAL IMPRESSION(S) / ED DIAGNOSES   Final diagnoses:  Viral URI with cough  Anxiety   Rx / DC Orders   ED Discharge Orders          Ordered    promethazine -dextromethorphan (PROMETHAZINE -DM) 6.25-15 MG/5ML syrup  4 times daily PRN        03/22/24 1418    clonazePAM (KLONOPIN) 0.5 MG tablet  3 times daily PRN        03/22/24 1418           Note:  This document was prepared using Dragon voice recognition software and may include unintentional dictation errors.    Margrette, Altie Savard A, PA-C 03/22/24 1755    Viviann Pastor, MD 03/23/24 1321

## 2024-03-22 NOTE — Discharge Instructions (Addendum)
 You were seen in the emergency department today for a cough. Your respiratory panel which includes COVID, influenza and RSV were negative.  Your rapid strep test is negative.  A viral cough may last up to 2-3 weeks.   Take tylenol  or ibuprofen  for pain or fever as directed.   Stay hydrated by drinking plenty of fluids to thin mucus. Get adequate amount of sleep and avoid overexertion. Consider a humidifier at night. Warm teas and a spoonful of honey may help reduce cough frequency.   Follow up with your primary care provider for further refills on anti-anxiety medications.   Use throat lozenges or Chloraseptic spray.  Gargle with warm salt water several times daily

## 2024-05-06 ENCOUNTER — Telehealth: Admitting: Family Medicine

## 2024-05-06 NOTE — Progress Notes (Signed)
 Pt did not show for visit [facility]
